# Patient Record
Sex: Female | Born: 1950 | Race: White | Hispanic: No | Marital: Married
Health system: Midwestern US, Community
[De-identification: ages and names within clinical notes are randomized; demographics above are authoritative.]

## PROBLEM LIST (undated history)

## (undated) DIAGNOSIS — R52 Pain, unspecified: Secondary | ICD-10-CM

## (undated) DIAGNOSIS — R109 Unspecified abdominal pain: Secondary | ICD-10-CM

## (undated) DIAGNOSIS — Z1239 Encounter for other screening for malignant neoplasm of breast: Secondary | ICD-10-CM

## (undated) DIAGNOSIS — R634 Abnormal weight loss: Secondary | ICD-10-CM

## (undated) DIAGNOSIS — M797 Fibromyalgia: Secondary | ICD-10-CM

## (undated) DIAGNOSIS — R9389 Abnormal findings on diagnostic imaging of other specified body structures: Secondary | ICD-10-CM

## (undated) DIAGNOSIS — M47812 Spondylosis without myelopathy or radiculopathy, cervical region: Secondary | ICD-10-CM

## (undated) DIAGNOSIS — M25512 Pain in left shoulder: Secondary | ICD-10-CM

## (undated) DIAGNOSIS — E039 Hypothyroidism, unspecified: Secondary | ICD-10-CM

## (undated) DIAGNOSIS — M7501 Adhesive capsulitis of right shoulder: Secondary | ICD-10-CM

---

## 2015-04-04 ENCOUNTER — Ambulatory Visit: Admit: 2015-04-04 | Discharge: 2015-04-04 | Payer: MEDICARE | Attending: Interventional Pain Medicine

## 2015-04-04 DIAGNOSIS — M47812 Spondylosis without myelopathy or radiculopathy, cervical region: Secondary | ICD-10-CM

## 2015-04-04 NOTE — Progress Notes (Signed)
Tiffany Wolfe  (December 02, 1950)    04/04/15    Subjective:     Tiffany Wolfe is 64 y.o. female who is here today for follow-up. It has been over a year since we have seen her. We usually treat her for her low back but now her neck has been bothering her. She is also having more numbness in the hands, both hands bother her. Constant numbness, worse when she is driving. She has not had carpal tunnel surgery. She is right handed. No neck surgery. She takes Aleve which helps to some degree. She has generalized arthritis which affects her quality of life, mood, activities of daily living and chores around the house. She has bilateral neck pain radiating into her upper shoulder blade region. Pain with medications 3/10, on a bad day 8/10. No neck surgery. She does not work. She used to work at [employer].    Chief Complaint   Patient presents with   ??? Neck Pain         Allergies:  Review of patient's allergies indicates no known allergies.    No past medical history on file.  No past surgical history on file.  No family history on file.  Social History     Social History   ??? Marital status: Married     Spouse name: N/A   ??? Number of children: N/A   ??? Years of education: N/A     Occupational History   ??? Not on file.     Social History Main Topics   ??? Smoking status: Never Smoker   ??? Smokeless tobacco: Not on file   ??? Alcohol use 0.0 oz/week     0 Standard drinks or equivalent per week   ??? Drug use: No   ??? Sexual activity: Not on file     Other Topics Concern   ??? Not on file     Social History Narrative   ??? No narrative on file       No current outpatient prescriptions on file prior to visit.     No current facility-administered medications on file prior to visit.        She has not tried physical therapy.   Date of last of last PT treatment: none    HPI    Review of Systems   Constitutional: Negative for fever.   HENT: Negative for hearing loss.    Respiratory: Negative for shortness of breath.    Gastrointestinal: Negative  for nausea, diarrhea and constipation.   Genitourinary: Negative for difficulty urinating.   Musculoskeletal: Positive for neck pain. Negative for back pain.   Skin: Negative for rash.   Neurological: Negative for headaches.   Hematological: Bruises/bleeds easily.   Psychiatric/Behavioral: Positive for sleep disturbance.         Objective:     Vitals:  Visit Vitals   ??? Temp 97.1 ??F (36.2 ??C)   ??? Ht  (1.676 m)   ??? Wt 180 lb (81.6 kg)   ??? BMI 29.05 kg/m2      BMI is not within normal limits.  Educational material provided to the patient      Physical Exam   Neurological:   Reflex Scores:       Tricep reflexes are 1+ on the right side and 1+ on the left side.       Bicep reflexes are 1+ on the right side and 1+ on the left side.       Brachioradialis  reflexes are 1+ on the right side and 1+ on the left side.      Patient alert and oriented times three, recent and remote memory intact, mood and affect normal, judgement and insight normal. Strength functional for ambulation. Balance and coordinational functional. Visualized skin intact. No visualized cyanosis, pulses intact. Cranial nerves 2-12 grossly intact. No obvious upper motor neuron signs seen. Sensation intact to light touch.  Negative Spurling's.  Tender along mid/low cervical facet joints with pain and decreased ROM.   Extension and rotation with muscle spasms.    Assessment:     1. Cervical spondylosis without myelopathy  XR Cervical Spine 4 Or 5 Vw    Ambulatory referral to Physical Therapy   2. Carpal tunnel syndrome, bilateral  EMG       Plan:     No orders of the defined types were placed in this encounter.      Orders Placed This Encounter   Procedures   ??? XR Cervical Spine 4 Or 5 Vw     Standing Status:   Future     Number of Occurrences:   1     Standing Expiration Date:   07/03/2015     Scheduling Instructions:      Xray cervical AP Lateral and Lateral Flexion Extension   ??? Ambulatory referral to Physical Therapy     Referral Priority:   Routine      Referral Type:   Physical Medicine     Referral Reason:   Physical Therapy     Requested Specialty:   Physical Therapy     Number of Visits Requested:   1   ??? EMG     Standing Status:   Future     Standing Expiration Date:   07/03/2015     Order Specific Question:   Which body part?     Answer:   Bilateral Upper Extremities     Treatment and Recommendations:  I discussed options with the patient in detail. We will get cervical spine x-rays. She may need cervical facet joint injections in the future but we will order physical therapy on her neck for strengthening, stretching and home exercise program. For her numbness in her hands we will set up an EMG of the upper extremities to evaluate for carpal tunnel syndrome. She understands the plan and is in agreement. Questions were answered. Chart was reviewed.      Follow up:  Return for EMG internal.    Diora Bellizzi, DO

## 2015-04-05 ENCOUNTER — Encounter

## 2015-04-10 ENCOUNTER — Ambulatory Visit: Admit: 2015-04-10 | Discharge: 2015-04-10 | Payer: MEDICARE | Attending: Interventional Pain Medicine

## 2015-04-10 DIAGNOSIS — G5603 Carpal tunnel syndrome, bilateral upper limbs: Secondary | ICD-10-CM

## 2015-04-10 NOTE — Progress Notes (Signed)
[phone]          EMG/NCV Report    Tiffany Wolfe June 25, 1951          Indications:  Arlo Fabricius is here today for electrodiagnostic testing of the upper extremities to evaluate for carpal tunnel syndrome.    Nerve Conduction Studies:  Nerve conduction studies of the upper extremities were tested. These were all normal with the exception of a mild to moderate delay in the bilateral median sensory distal latencies, worse on the right than on the left. There was also a reduction in the amplitude, more on the right than left. There was also mild prolongation of the right median motor distal latency. The left side was normal. Other nerves tested were normal including limb temperature.    Needle examination of bilateral deltoid, biceps, triceps, pronator teres, first dorsal interosseous of hand, abductor pollicis brevis, and brachioradialis were tested. Some neurogenic units were seen in the bilateral abductor pollicis brevis muscles. There was no active denervation seen. There were also some neurogenic units seen in the C6 muscles tested of 1+ increased amplitude and duration.    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 The right is moderate, the left is mild. Some chronic neurogenic units were also seen at C6. We went over the x-ray results. She will do therapy. We will see her in six weeks for follow-up. If she has a flare up of her neck pain then she may need the cervical facet joint injections sooner. We will also order her some bilateral carpal tunnel wrist splints to wear at night. She understands the plan and is in agreement. Questions were answered.       Ileana Roup, DO

## 2015-04-18 ENCOUNTER — Encounter: Attending: Rehabilitative and Restorative Service Providers"

## 2015-04-18 NOTE — Telephone Encounter (Signed)
I left a voicemail for patient to call.

## 2015-04-25 NOTE — Telephone Encounter (Signed)
I spoke to patient, she wants to pick up her carpal tunnel splints from the front desk.  I will give the front desk both splints and the bill for patient to sign.

## 2015-04-26 ENCOUNTER — Encounter

## 2015-04-26 DIAGNOSIS — G5603 Carpal tunnel syndrome, bilateral upper limbs: Secondary | ICD-10-CM

## 2015-04-30 NOTE — Progress Notes (Signed)
[facility]  Spine Surgery  Advanced Pain Management           DME Coordinator: Simmie Daviesrystal Kairee Kozma        Patient Name: Tiffany Wolfe DOB: [birth year]        Date: 04/26/2015       DME Fitting: Bilateral Wrist Splint - [phone]    The above brace has been ordered by Dr. Lucianne MussKumar for bilateral carpal tunnel splints. she will benefit from this brace as it helps to reduce weakness and instability of the wrist. This rigid orthosis is required to stabilize the wrist to improve function and decrease pain.    I have shown the patient the brace and demonstrated how to wear it and adjust the straps. I advised the patient wear the splint at night or as directed by their provider.     I advised the patient to adjust the brace for comfort and loosen or tighten as needed while maintaining restriction of motion during the night.    I had the patient try on the brace and we assessed the fit. The patient was fit the standard size splint.  Per my evaluation and discussion with the patient, this brace will work well for the patient.    The patient expressed satisfaction with the fit of this brace and understanding of it's use.                  [address], [address] [place], [place] [id number]  Phone [phone]/Fax [phone]

## 2015-05-22 ENCOUNTER — Encounter: Attending: Interventional Pain Medicine

## 2015-05-22 ENCOUNTER — Encounter

## 2015-05-23 ENCOUNTER — Encounter: Attending: Rehabilitative and Restorative Service Providers"

## 2016-01-04 IMAGING — DX THORACIC SPINE 2 VIEWS
2 series · 2 of 2 positions shown · non-contrast
Comparison: none

THORACIC SPINE, 01/04/2016 [DATE]:
CLINICAL INDICATION:  Anterior chest pain radiating to the right back.

[lateral]
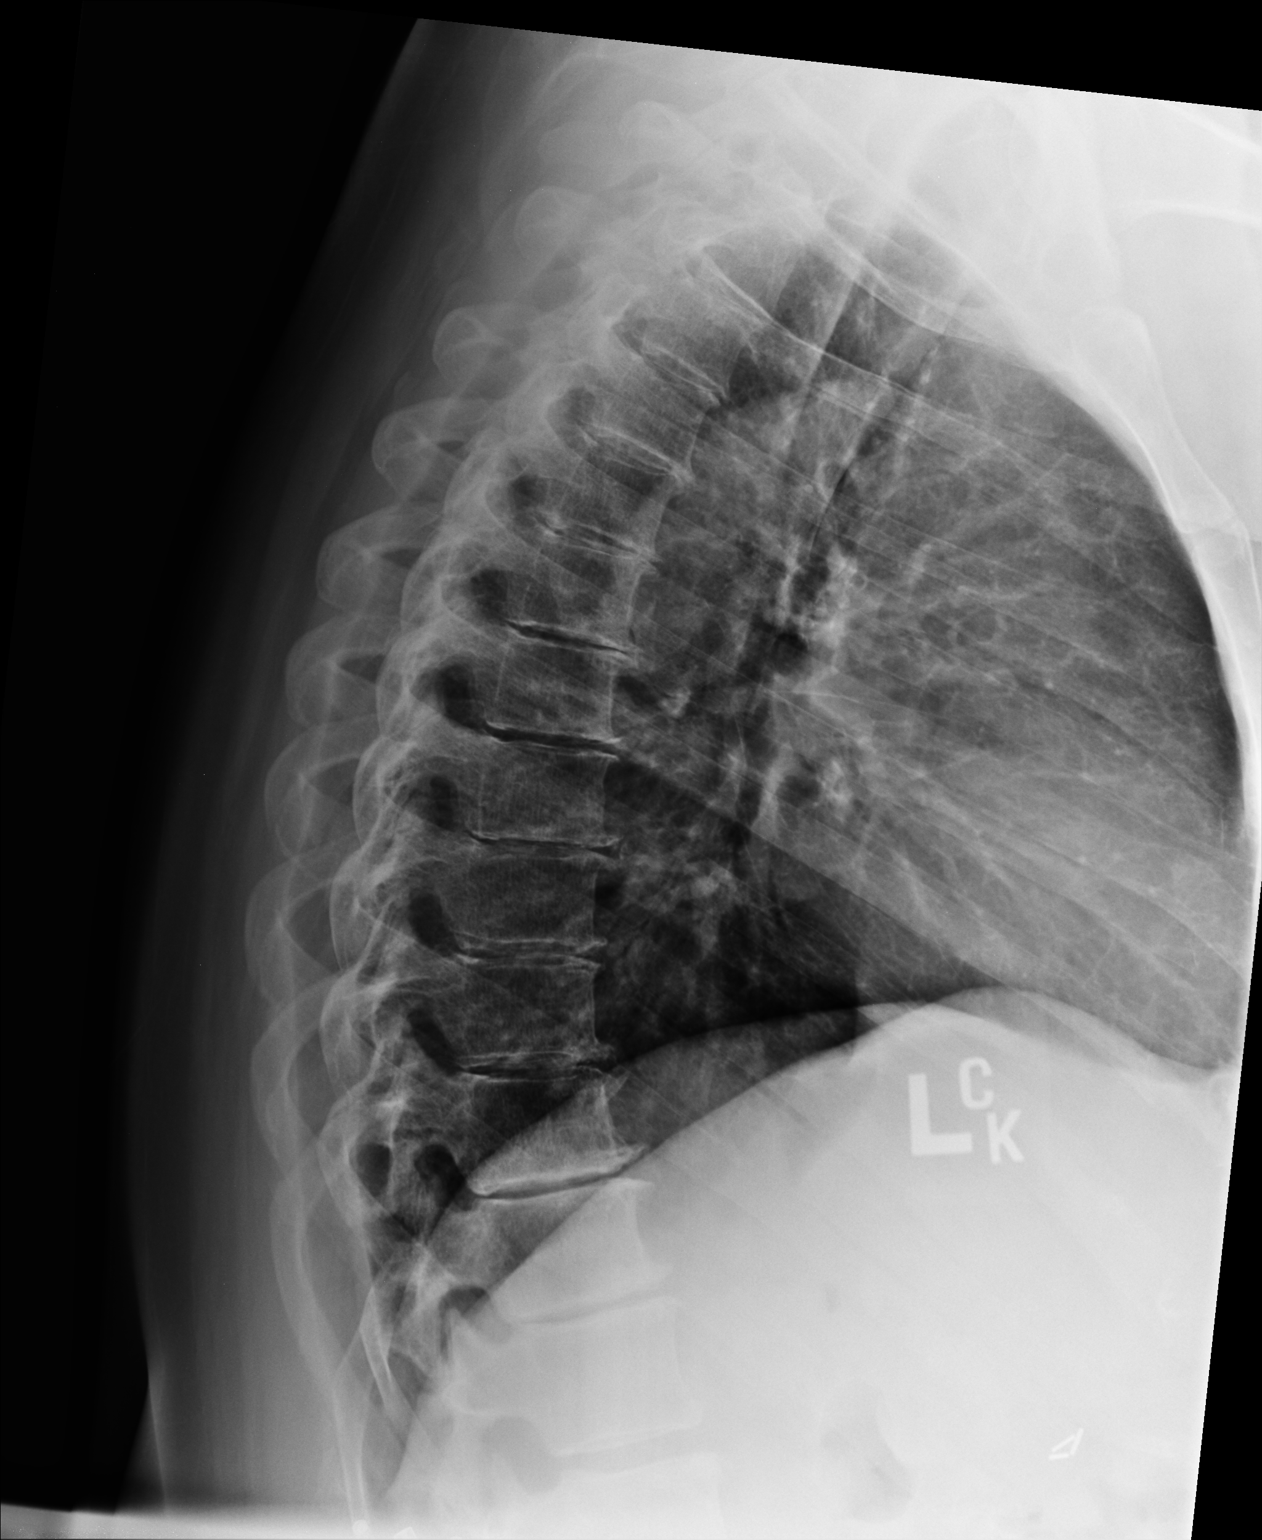

[AP]
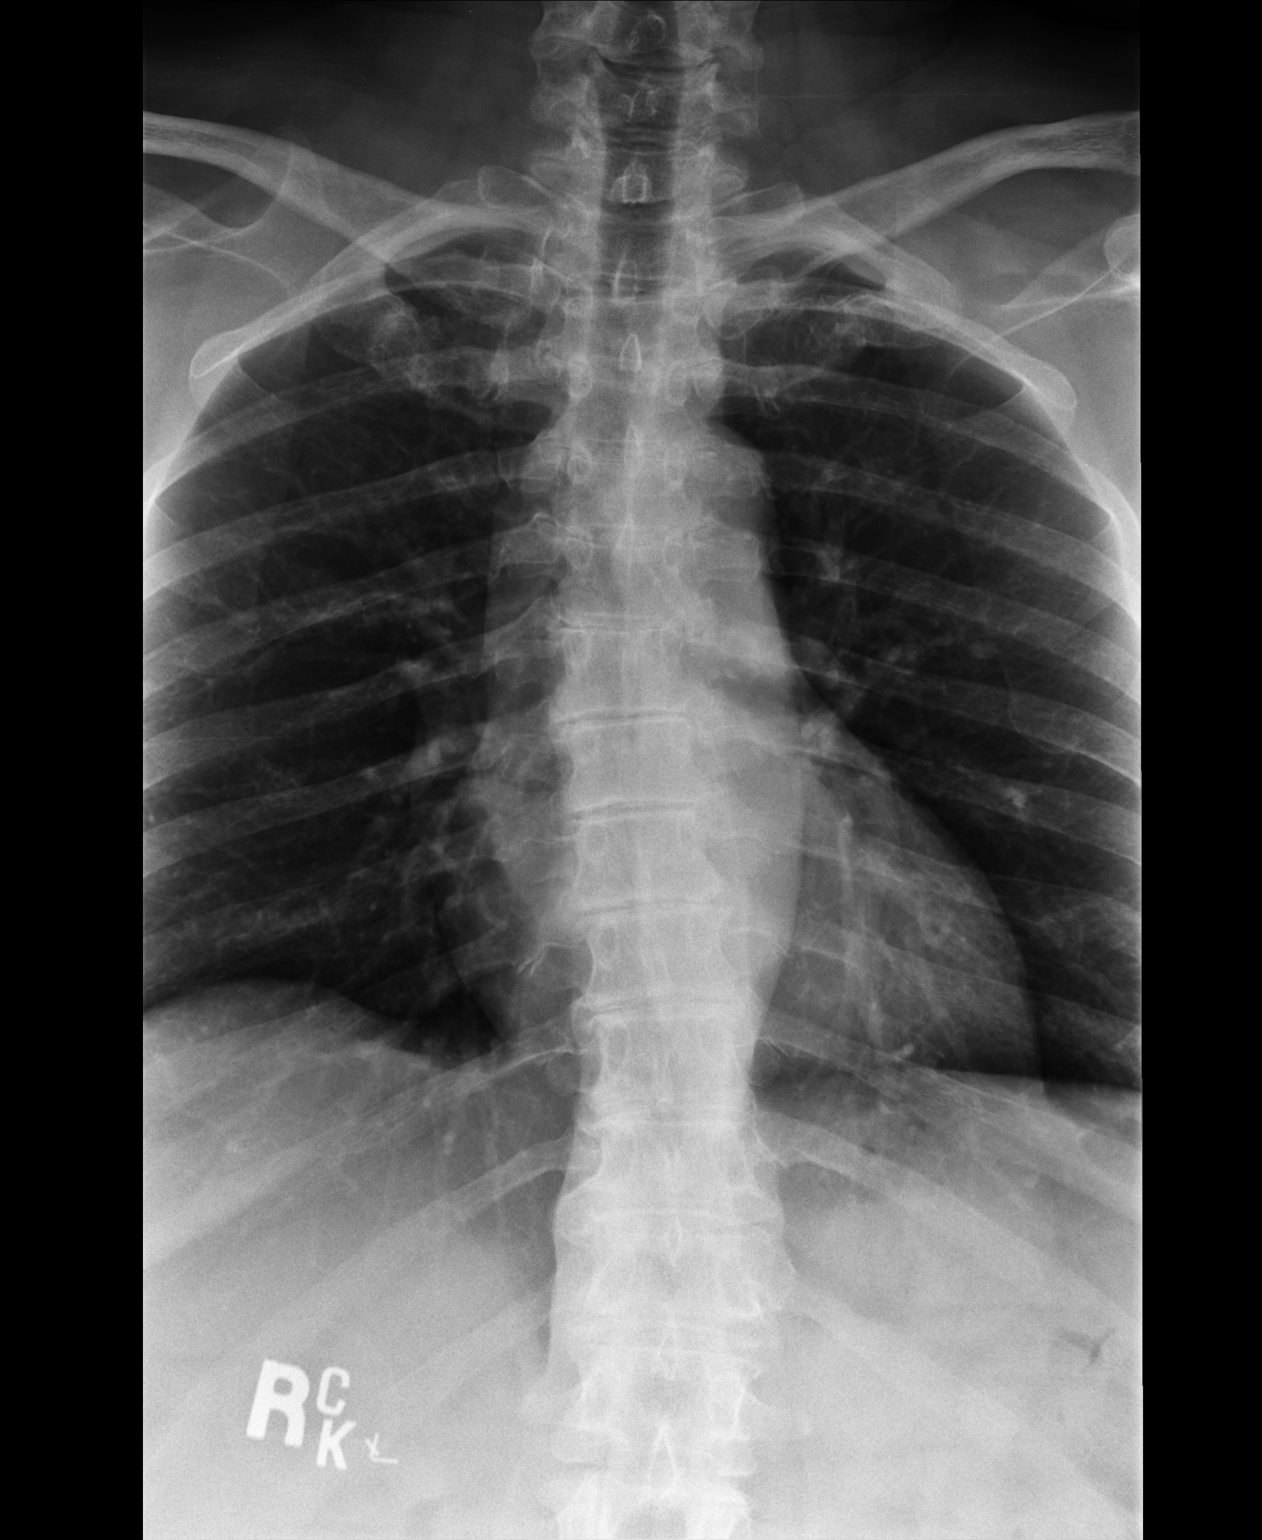

[2 of 2 positions shown; findings below may reference images not displayed]

FINDINGS: There is mild thoracic scoliosis convex to the right. Mild
arthritic
changes present. No compression deformities.
IMPRESSION: Mild arthritic change.

## 2016-01-04 IMAGING — DX CHEST PA AND LATERAL
2 series · 2 of 2 positions shown · non-contrast
Comparison: none

CLINICAL INDICATION:  Anterior chest pain and epigastric pain radiating under
right shoulder blade for 6 weeks.

[lateral]
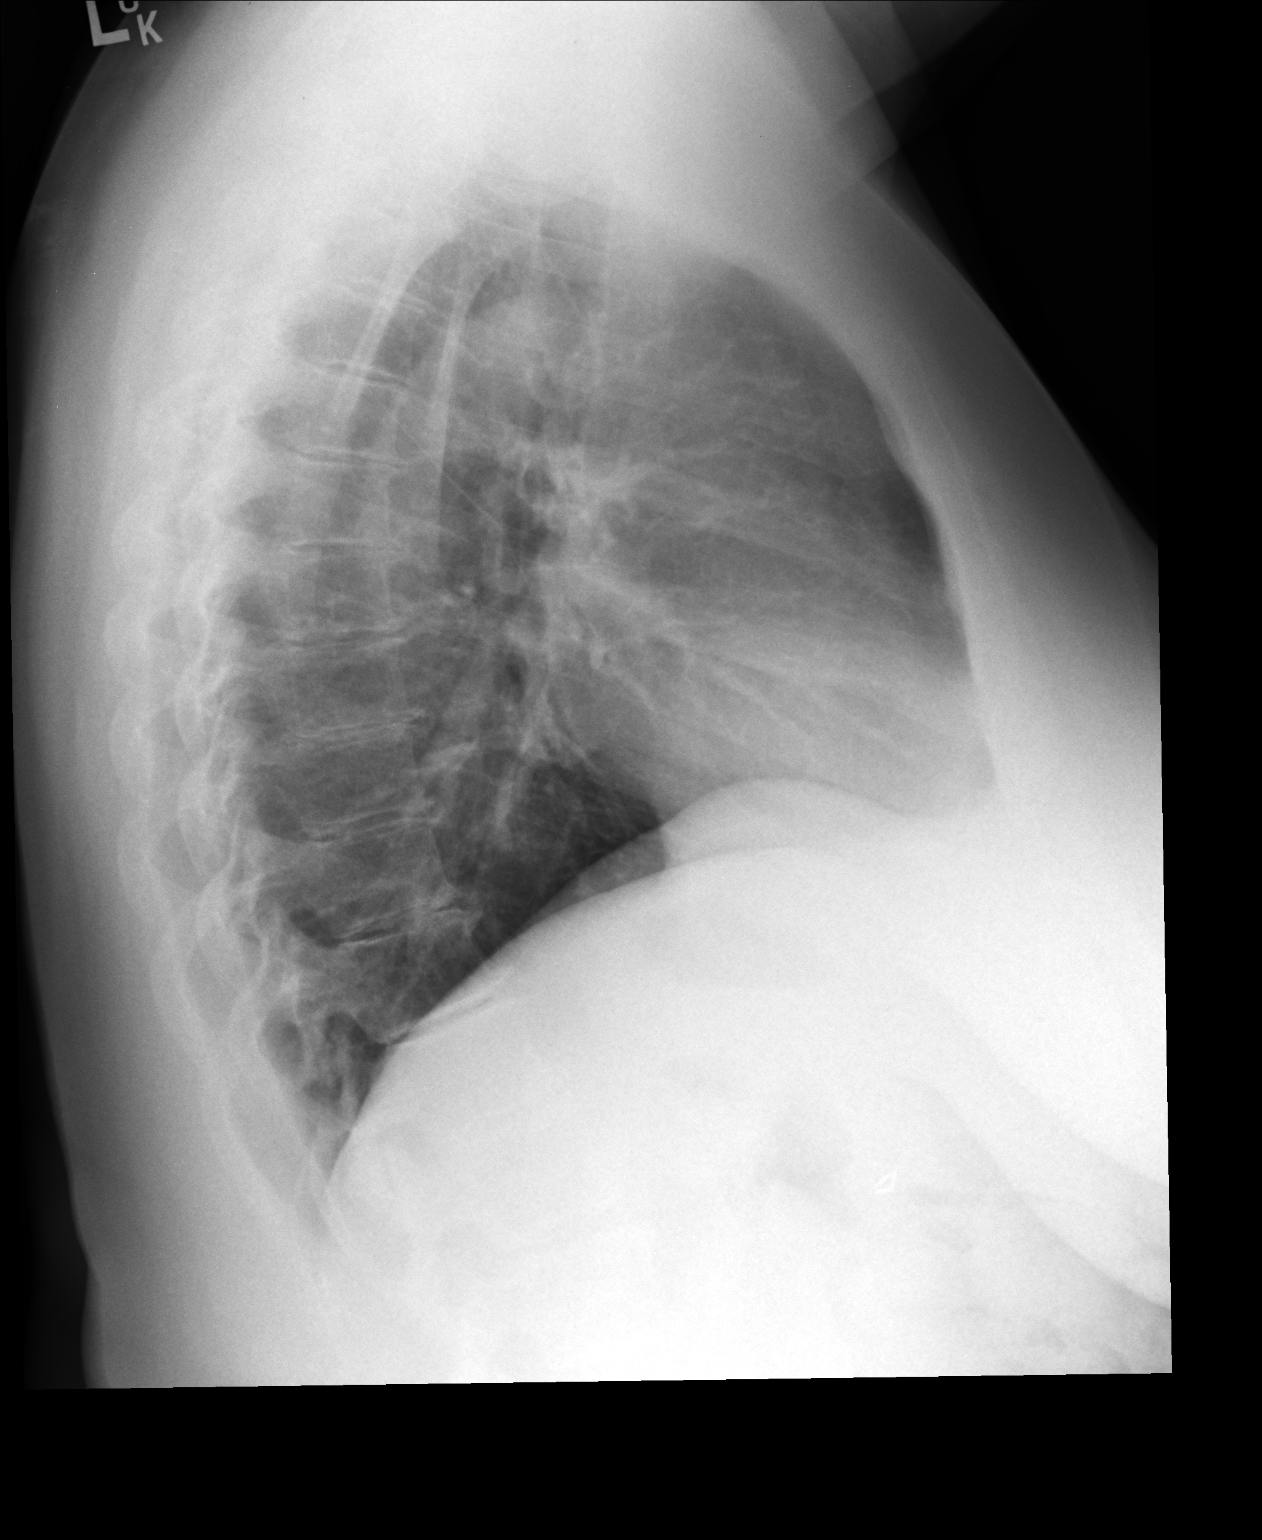

[PA]
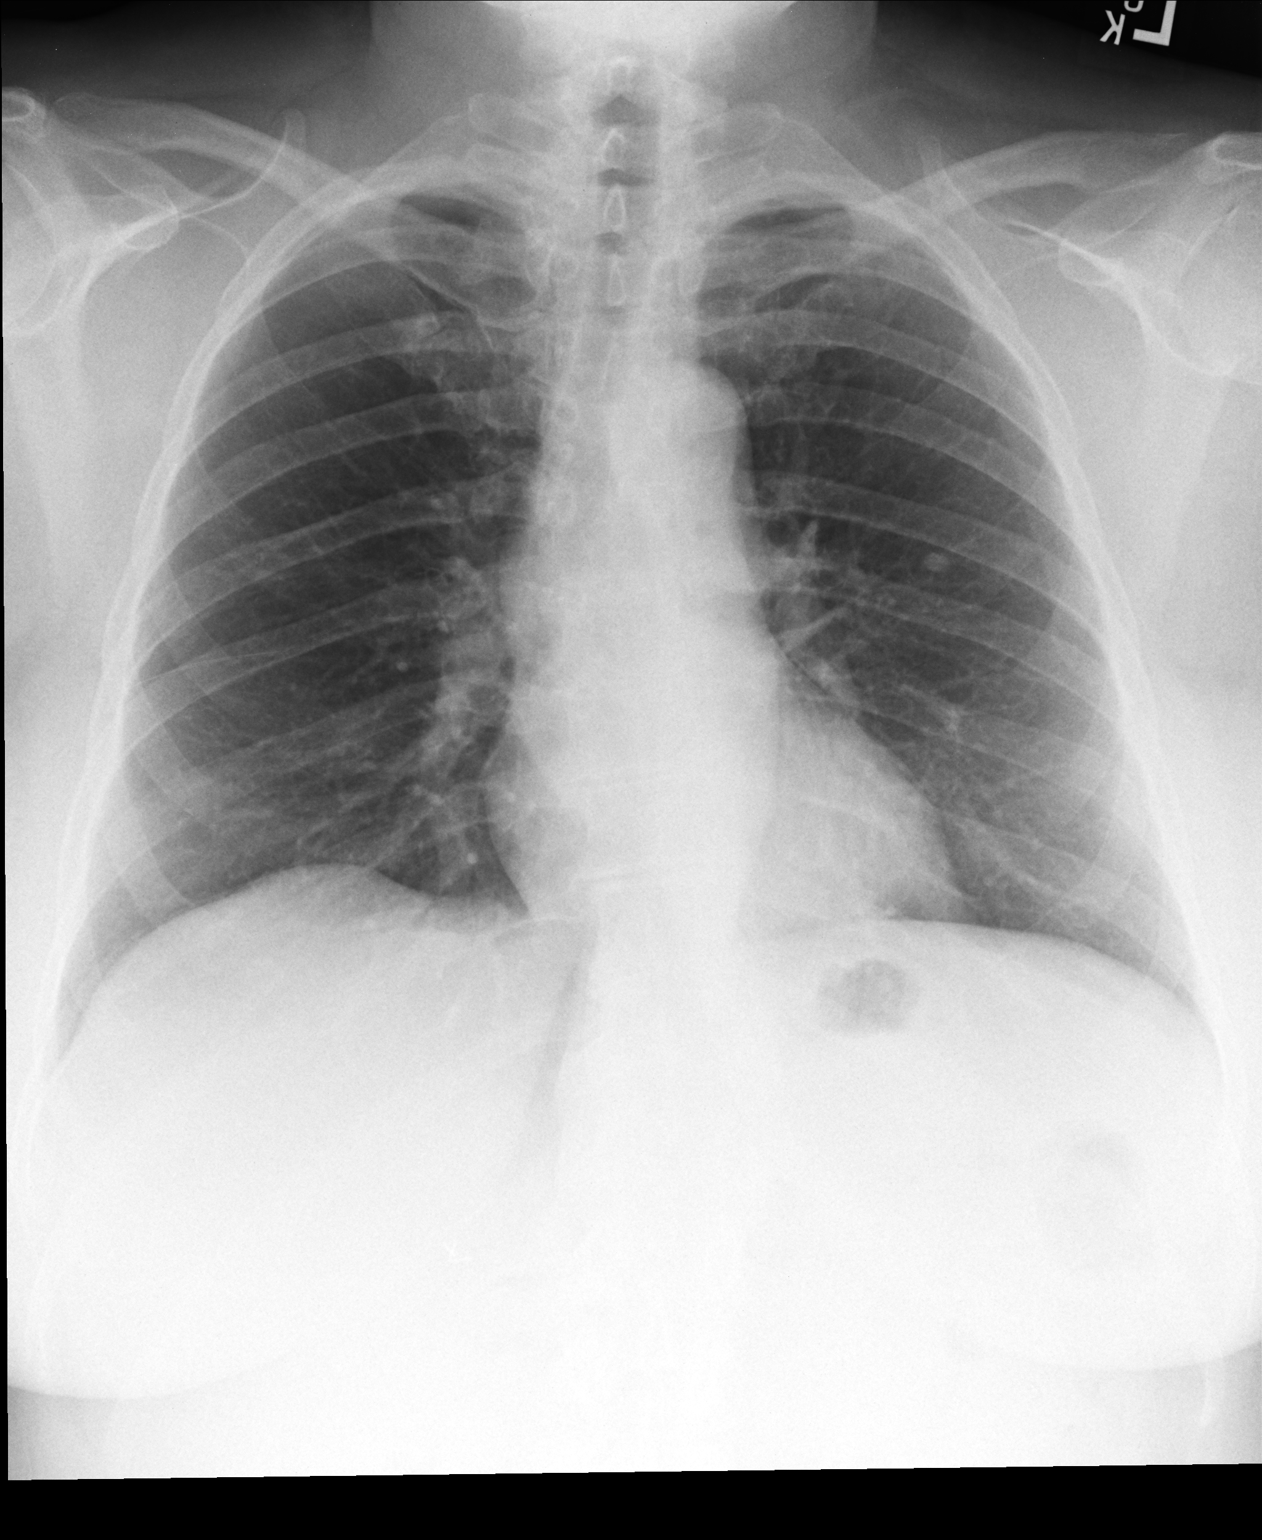

[2 of 2 positions shown; findings below may reference images not displayed]

FINDINGS: A calcified granuloma is present in the left midlung field. The
lungs
are well-expanded and clear. Heart size normal. The bones are intact.
Cholecystectomy.
IMPRESSION: Calcified granuloma in the left midlung field. No acute findings.

## 2017-12-09 IMAGING — CT CT LUNG SCREENING
1 of 3 series · 4 of 36 positions shown, 5 images · non-contrast
Comparison: None

CT LUNG SCREENING, 12/09/2017 [DATE]: 
CLINICAL INDICATION:   30 pack years history of smoking. Patient is not a 
current smoker. 
A search for DICOM formatted images was conducted for prior CT imaging studies 
completed at a non-affiliated media free facility.
TECHNIQUE: Screening computed tomographic images of the chest are performed 
from 
the thoracic outlet through the diaphragms without intravenous contrast 
utilizing dose reduction technique. The patient's dose for this study is
mGy.

[Series 4: coronal cor · coronal · 0.64mm/px · 4 of 142 slices shown, 5 images]
[im 29/142  mediastinal]
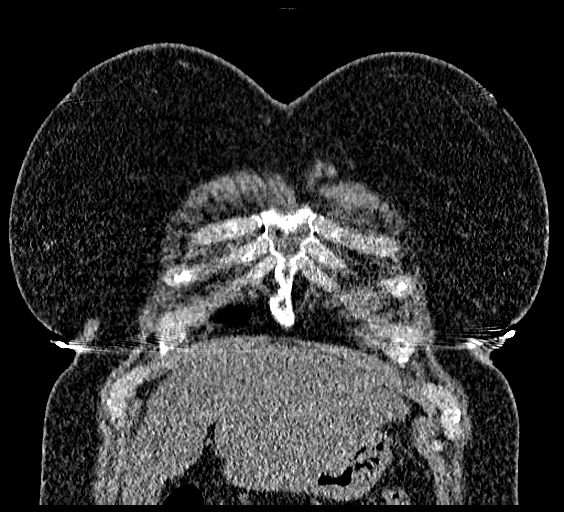
[im 29/142  lung]
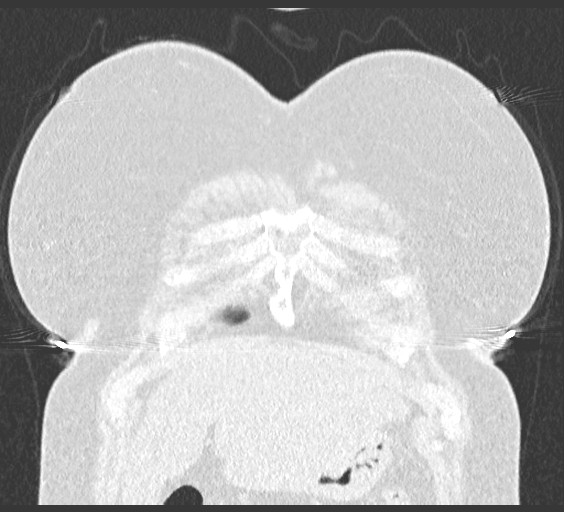
[im 57/142  lung]
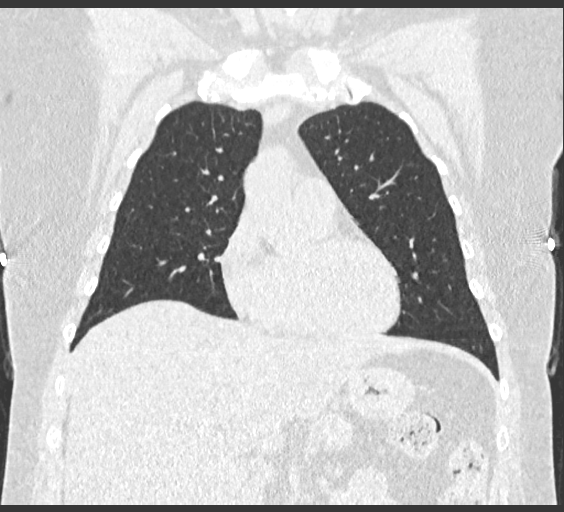
[im 85/142  lung]
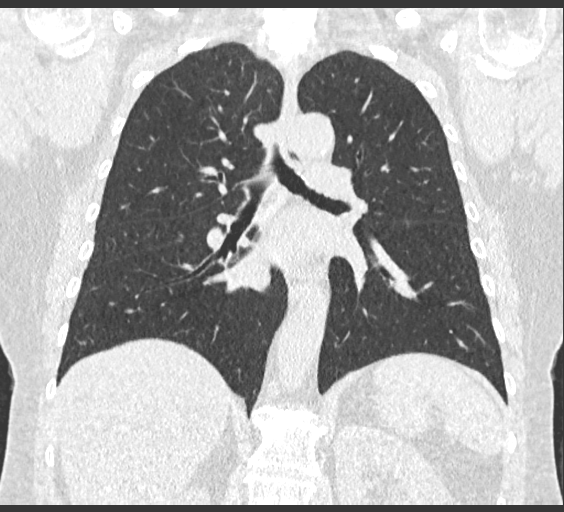
[im 113/142  lung]
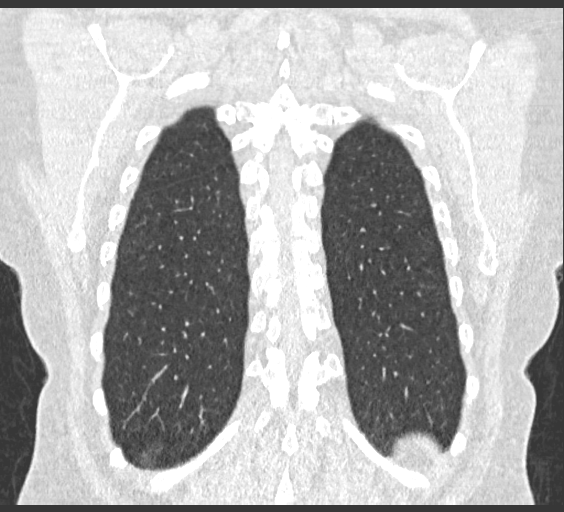

[4 of 36 positions shown; findings below may reference images not displayed]

FINDINGS: There is a small 5 mm calcified granuloma at the posterior segment of the left 
upper lobe. No visible noncalcified pulmonary nodules. The central pulmonary 
artery and thoracic aorta are normal in caliber. Lungs are clear of pulmonary 
infiltrates. No visible liver and adrenals are normal.
IMPRESSION: Small old healed calcified granuloma at the left upper lobe. 
L-RADS:  (1) No nodules and definitely benign nodules.  Continue annual 
screening with low dose CT in 12 months. 
RADIATION DOSE REDUCTION: All CT scans are performed using radiation dose 
reduction techniques, when applicable.  Technical factors are evaluated and 
adjusted to ensure appropriate moderation of exposure.  Automated dose 
management technology is applied to adjust the radiation doses to minimize 
exposure while achieving diagnostic quality images.

## 2017-12-09 IMAGING — MG MAMMOGRAPHY SCREENING BILATERAL 3D TOMOSYNTHESIS WITH CAD
4 of 12 series · 4 of 40 positions shown · non-contrast
Comparison: None available 
BREAST DENSITY: (Level A) The breasts are almost entirely fatty.

MAMMOGRAPHY SCREENING BILATERAL 3D TOMOSYNTHESIS WITH CAD, 12/09/2017 [DATE]: 
CLINICAL INDICATION: Screening
TECHNIQUE: Digital bilateral mammograms and 3-D Tomosynthesis were obtained. 
These were interpreted both primarily and with the aid of computer-aided 
detection system.

[R CC synth-2D]
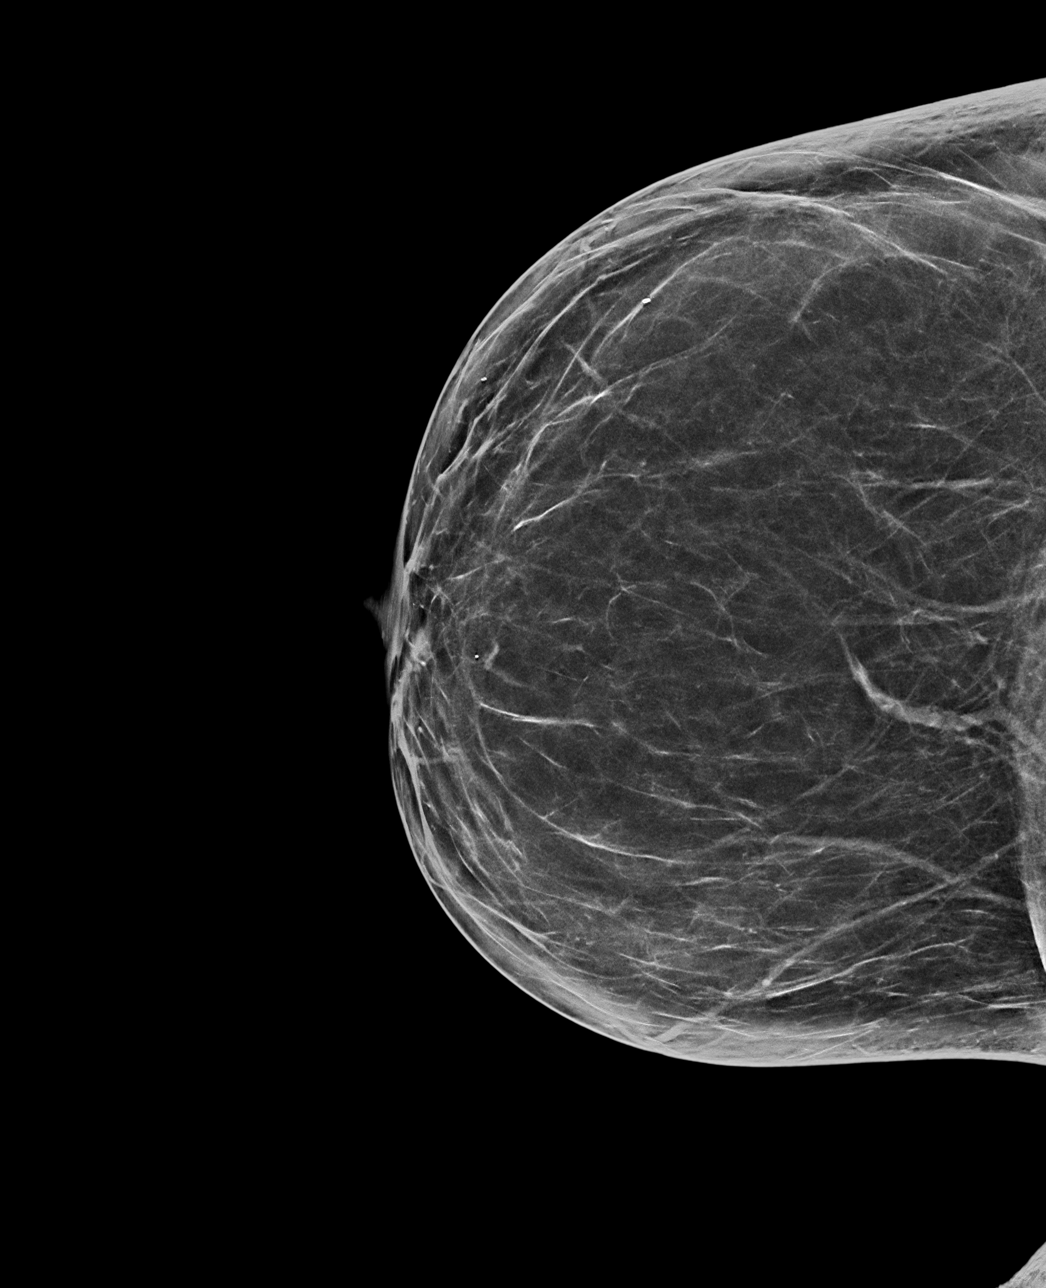

[L MLO synth-2D]
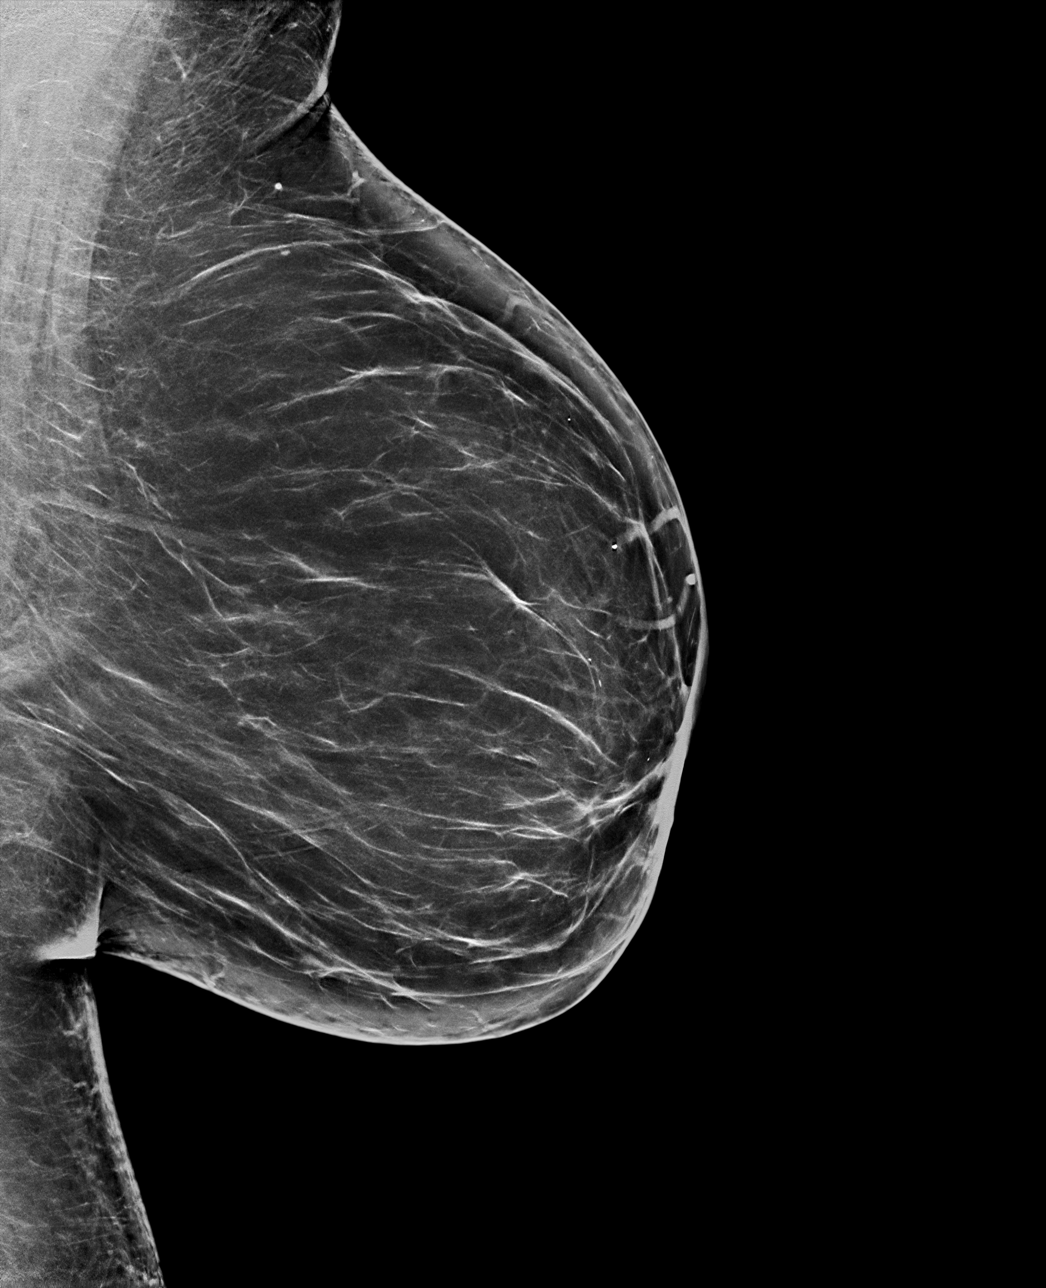

[R MLO synth-2D]
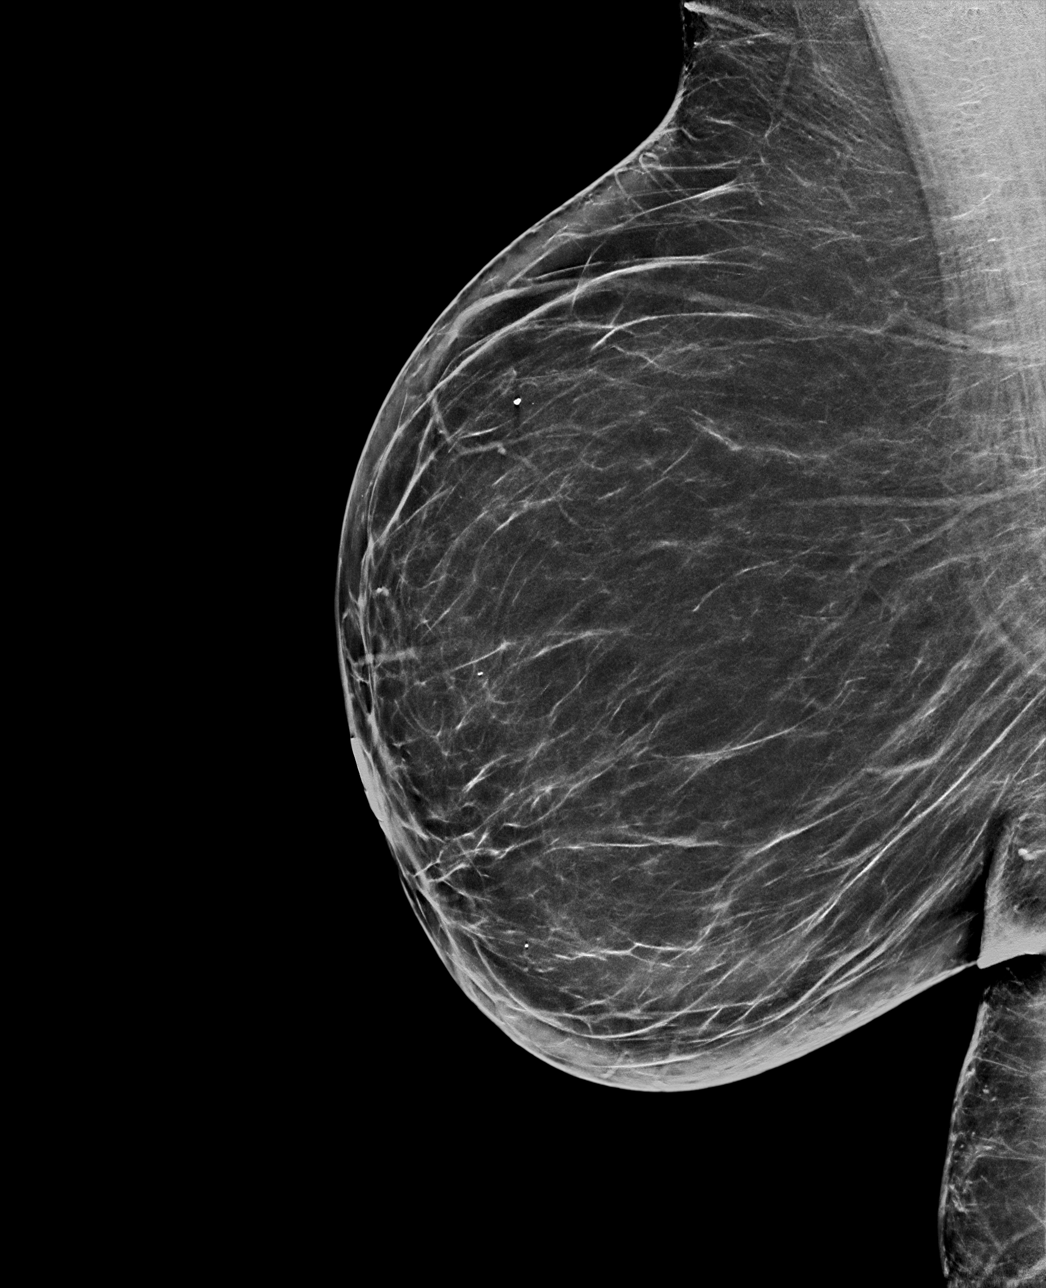

[L CC synth-2D]
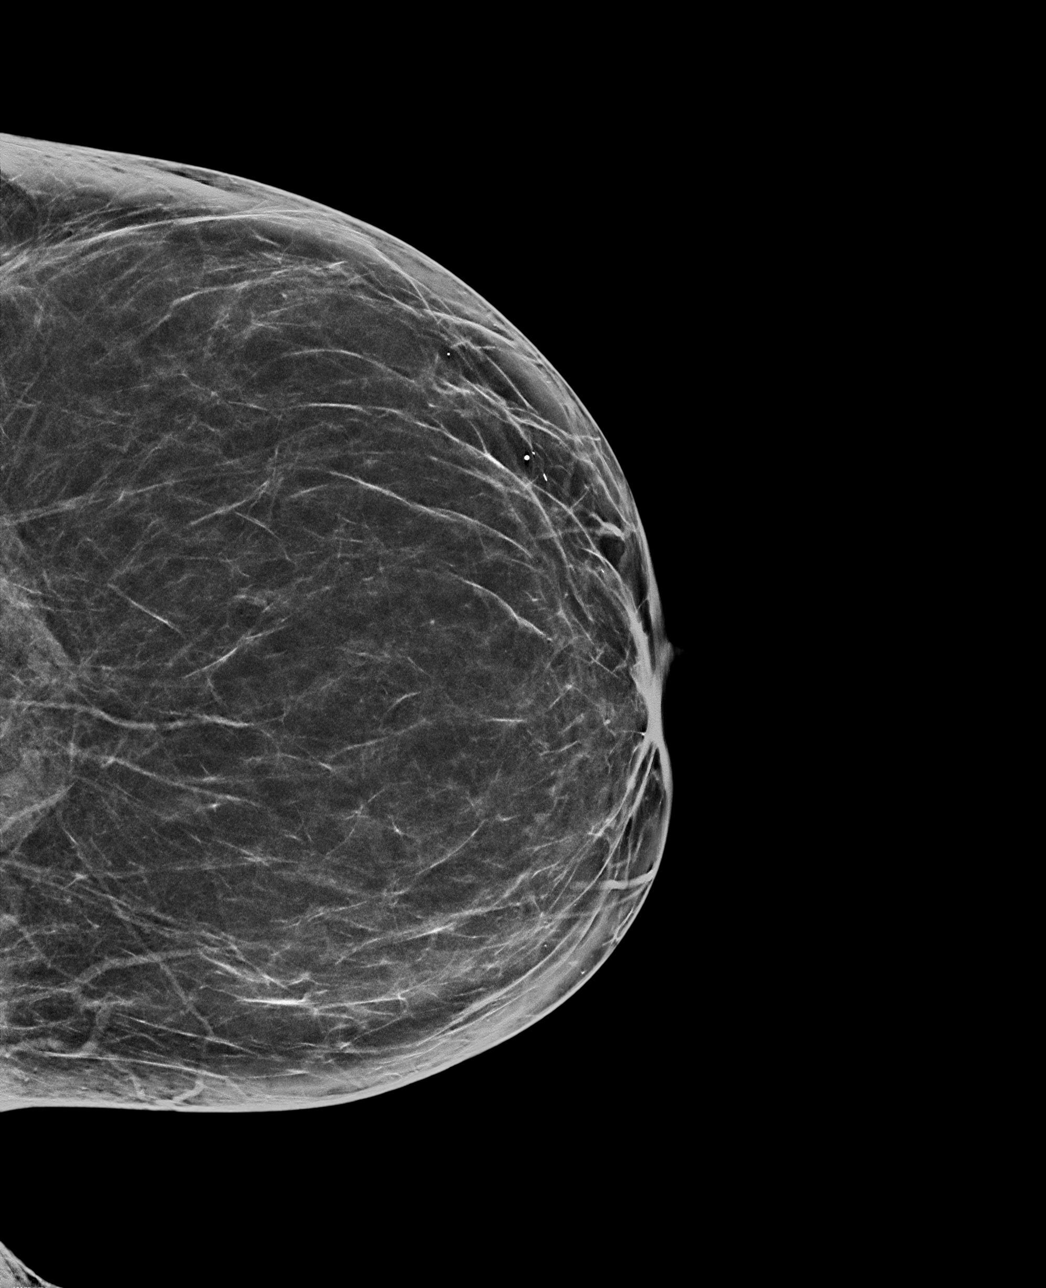

[4 of 40 positions shown; findings below may reference images not displayed]

FINDINGS: Scattered fibroglandular densities are seen bilaterally. Benign 
calcifications are identified. No mass or malignant-appearing 
microcalcifications are seen. No evidence of skin thickening.
IMPRESSION: ( BI-RADS 2) Benign findings. Routine mammographic follow-up is recommended.

## 2017-12-22 LAB — HEPATITIS C ANTIBODY: Antibody Screen: NEGATIVE

## 2018-03-31 NOTE — Progress Notes (Signed)
Patient: Tiffany Wolfe    Date of Birth: 02-19-1951    Date: 03/31/18       Patient Active Problem List    Diagnosis Date Noted   ??? 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 Urinary tract infectious disease 03/31/2018   ??? Urinary incontinence 03/31/2018   ??? Pterygium of left eye 03/03/2017   ??? Fatty liver 05/27/2016   ??? Hypertension 05/27/2016   ??? Early stage nonexudative age-related macular degeneration of both eyes 08/21/2015   ??? Migraine with aura and without status migrainosus, not intractable 08/21/2015   ??? Pain in right eye 08/16/2015   ??? Cervical spondylosis without myelopathy 04/04/2015   ??? Astigmatism with presbyopia 03/15/2015   ??? Floaters 03/15/2015   ??? Pseudophakia 03/15/2015   ??? Fibromyalgia 03/24/2014   ??? GERD (gastroesophageal reflux disease) 03/24/2014   ??? HLD (hyperlipidemia) 03/24/2014   ??? Hypothyroid 03/24/2014     Past Medical History:   Diagnosis Date   ??? Fibromyalgia    ??? Hyperlipidemia    ??? Hypertension    ??? Hypothyroidism      Past Surgical History:   Procedure Laterality Date   ??? CHOLECYSTECTOMY     ??? HYSTERECTOMY, VAGINAL     ??? SPINAL FUSION       Family History   Problem Relation Age of Onset   ??? Mult Sclerosis Brother    ??? Cancer Brother      Social History     Socioeconomic History   ??? Marital status: Married     Spouse name: Not on file   ??? Number of children: Not on file   ??? Years of education: Not on file   ??? Highest education level: Not on file   Occupational History   ??? Not on file   Social Needs   ??? Financial resource strain: Not on file   ??? Food insecurity:     Worry: Not on file     Inability: Not on file   ??? Transportation needs:     Medical: Not on file     Non-medical: Not on file   Tobacco Use   ??? Smoking status: Former Smoker     Packs/day: 2.00     Years: 30.00     Pack years: 60.00     Types: Cigarettes     Last attempt to quit: 03/31/2006     Years since quitting: 12.0   ??? Smokeless tobacco: Never Used   Substance and Sexual Activity   ??? Alcohol use: Yes     Alcohol/week: 0.0 oz   ???  Drug use: No   ??? Sexual activity: Not on file   Lifestyle   ??? Physical activity:     Days per week: Not on file     Minutes per session: Not on file   ??? Stress: Not on file   Relationships   ??? Social connections:     Talks on phone: Not on file     Gets together: Not on file     Attends religious service: Not on file     Active member of club or organization: Not on file     Attends meetings of clubs or organizations: Not on file     Relationship status: Not on file   ??? Intimate partner violence:     Fear of current or ex partner: Not on file     Emotionally abused: Not on file     Physically abused: Not on file  Forced sexual activity: Not on file   Other Topics Concern   ??? Not on file   Social History Narrative   ??? Not on file     Current Outpatient Medications on File Prior to Visit   Medication Sig Dispense Refill   ??? DULoxetine (CYMBALTA) 20 MG extended release capsule Take 40 mg by mouth daily  3   ??? levothyroxine (SYNTHROID) 200 MCG tablet Take 200 mcg by mouth daily     ??? metoprolol succinate (TOPROL XL) 50 MG extended release tablet Take 50 mg by mouth daily     ??? simvastatin (ZOCOR) 10 MG tablet Take 10 mg by mouth daily     ??? ibuprofen (ADVIL;MOTRIN) 200 MG tablet Take 200 mg by mouth every 6 hours as needed for Pain       No current facility-administered medications on file prior to visit.      Allergies   Allergen Reactions   ??? Seasonal Itching       Chief Complaint   Patient presents with   ??? Establish Care     Former Dr. Laurie Panda    ??? Shoulder Pain     LT x 2 weeks, no known injury        HPI    Symptoms:Pain  Location:L shoulder  Quality:Aches, shooting pain  Severity:8/10  Duration:2 weeks  Timing:constant  Context:Denies  Alleviating/aggravting factors: Motrin HS relieves symptoms.  Aggravated with movement  Associated signs & symptoms:Ocassional numbness, tingling down the arm.  Denies change in strength   Denies fever chills   No limitation to ROM 2/2 pain  Never had this before   She is right  handed    Review of Systems   Constitutional: Negative for activity change, appetite change and chills.   HENT: Negative for congestion, dental problem and drooling.    Musculoskeletal: Negative for gait problem and joint swelling.        LT shoulder pain        Physical Exam  Vitals:    03/31/18 1338   BP: 118/60   Pulse: 57   SpO2: 98%   Body mass index is 27.66 kg/m??.  Physical Exam   Constitutional: She appears well-developed and well-nourished. No distress.   HENT:   Head: Normocephalic and atraumatic.   Right Ear: External ear normal.   Left Ear: External ear normal.   Eyes: Conjunctivae and EOM are normal.   Neck: Normal range of motion.   Musculoskeletal:        Left shoulder: She exhibits tenderness and pain. She exhibits normal range of motion, no bony tenderness, no swelling, no effusion, no crepitus, no deformity, no laceration, no spasm, normal pulse and normal strength.   Skin: She is not diaphoretic.   Nursing note and vitals reviewed.      Assessment:  Tiffany Wolfe was seen today for establish care and shoulder pain.    Diagnoses and all orders for this visit:    Acute pain of left shoulder  -     XR SHOULDER LEFT (MIN 2 VIEWS); Future  Suspect frozen shoulder  Check x-ray   F/u shoulder injection  hand out provided, had a lengthy discussion with patient about treatment, it's side effects, the diagnosis & etiology of symptoms, along with management and expectations of outcome with the recommended treatment. Patient verbalized understanding.        Orders Placed This Encounter   Procedures   ??? XR SHOULDER LEFT (MIN 2 VIEWS)  Standing Status:   Future     Standing Expiration Date:   04/01/2019     Order Specific Question:   Reason for exam:     Answer:   left shoulder pain      Total visit time >30 mins, more than 50% time spent on counseling, treatment, side effects, and follow up      Willis ModenaSaadia Donnette Macmullen, MD

## 2018-04-06 NOTE — Telephone Encounter (Signed)
Patient calling asking for x-ray results. She was scheduled to see you yesterday.     Please advise

## 2018-04-06 NOTE — Telephone Encounter (Signed)
X-ray showed:    A possible small fracture - before I or anyone else injects I would like her to see ortho regarding this first. [facility] and give pt information to call and make appt.

## 2018-04-07 NOTE — Telephone Encounter (Signed)
Left vm to return call to the office. 

## 2018-04-08 NOTE — Telephone Encounter (Signed)
Patient would like someone to call her regarding her shoulder xray results

## 2018-04-09 NOTE — Telephone Encounter (Signed)
Left vm for pt to call office back

## 2018-04-09 NOTE — Telephone Encounter (Signed)
Please advise

## 2018-04-12 ENCOUNTER — Encounter

## 2018-04-12 NOTE — Telephone Encounter (Signed)
Patient advised and information given. Referral placed

## 2018-06-04 ENCOUNTER — Encounter

## 2018-09-01 ENCOUNTER — Ambulatory Visit: Admit: 2018-09-01 | Discharge: 2018-09-01 | Payer: MEDICARE | Attending: Family Medicine

## 2018-09-01 ENCOUNTER — Inpatient Hospital Stay: Admit: 2018-09-01 | Payer: MEDICARE

## 2018-09-01 ENCOUNTER — Inpatient Hospital Stay: Payer: MEDICARE

## 2018-09-01 ENCOUNTER — Encounter

## 2018-09-01 DIAGNOSIS — R109 Unspecified abdominal pain: Secondary | ICD-10-CM

## 2018-09-01 LAB — POCT URINALYSIS DIPSTICK
Spec Grav, UA: 1.025
pH, UA: 6

## 2018-09-01 LAB — COMPREHENSIVE METABOLIC PANEL
ALT: 45 U/L — ABNORMAL HIGH (ref 0–33)
AST: 49 U/L — ABNORMAL HIGH (ref 0–35)
Albumin: 4.8 g/dL — ABNORMAL HIGH (ref 3.5–4.6)
Alkaline Phosphatase: 63 U/L (ref 40–130)
Anion Gap: 13 mEq/L (ref 9–15)
BUN: 14 mg/dL (ref 8–23)
CO2: 27 mEq/L (ref 20–31)
Calcium: 9.8 mg/dL (ref 8.5–9.9)
Chloride: 99 mEq/L (ref 95–107)
Creatinine: 0.74 mg/dL (ref 0.50–0.90)
GFR African American: >60 (ref >60)
GFR Non-African American: >60 (ref >60)
Globulin: 3.1 g/dL (ref 2.3–3.5)
Glucose: 113 mg/dL — ABNORMAL HIGH (ref 70–99)
Potassium: 4.2 mEq/L (ref 3.4–4.9)
Sodium: 139 mEq/L (ref 135–144)
Total Bilirubin: 0.4 mg/dL (ref 0.2–0.7)
Total Protein: 7.9 g/dL (ref 6.3–8.0)

## 2018-09-01 MED ORDER — BUDESONIDE-FORMOTEROL FUMARATE 80-4.5 MCG/ACT IN AERO
Freq: Two times a day (BID) | RESPIRATORY_TRACT | 3 refills | Status: DC
Start: 2018-09-01 — End: 2018-09-22

## 2018-09-01 MED ORDER — ALBUTEROL SULFATE HFA 108 (90 BASE) MCG/ACT IN AERS
108 (90 Base) MCG/ACT | Freq: Four times a day (QID) | RESPIRATORY_TRACT | 3 refills | Status: DC | PRN
Start: 2018-09-01 — End: 2018-09-22

## 2018-09-01 NOTE — Progress Notes (Signed)
Vaccine Information Sheet, "Influenza - Inactivated" OR "Live - Intranasal"  given to Tiffany MediciPamela J Bonnet, or parent/legal guardian of  Tiffany Mediciamela J Sedita and verbalized understanding.    Patient responses:    Have you ever had a reaction to a flu vaccine? No  Are you able to eat eggs without adverse effects?  Yes  Do you have any current illness?  No  Have you ever had Guillian Barre Syndrome?  No    Flu vaccine given per order. Please see immunization tab.

## 2018-09-01 NOTE — Progress Notes (Signed)
Patient: Tiffany Wolfe    Date of Birth: 1951-08-13    Date: 09/01/18       Patient Active Problem List    Diagnosis Date Noted   ??? 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 Urinary tract infectious disease 03/31/2018   ??? Urinary incontinence 03/31/2018   ??? Pterygium of left eye 03/03/2017   ??? Fatty liver 05/27/2016   ??? Hypertension 05/27/2016   ??? Early stage nonexudative age-related macular degeneration of both eyes 08/21/2015   ??? Migraine with aura and without status migrainosus, not intractable 08/21/2015   ??? Pain in right eye 08/16/2015   ??? Cervical spondylosis without myelopathy 04/04/2015   ??? Astigmatism with presbyopia 03/15/2015   ??? Floaters 03/15/2015   ??? Pseudophakia 03/15/2015   ??? Fibromyalgia 03/24/2014   ??? GERD (gastroesophageal reflux disease) 03/24/2014   ??? HLD (hyperlipidemia) 03/24/2014   ??? Hypothyroid 03/24/2014     Past Medical History:   Diagnosis Date   ??? Fibromyalgia    ??? Hyperlipidemia    ??? Hypertension    ??? Hypothyroidism      Past Surgical History:   Procedure Laterality Date   ??? CHOLECYSTECTOMY     ??? HYSTERECTOMY, VAGINAL     ??? SPINAL FUSION       Family History   Problem Relation Age of Onset   ??? Mult Sclerosis Brother    ??? Cancer Brother      Social History     Socioeconomic History   ??? Marital status: Married     Spouse name: Not on file   ??? Number of children: Not on file   ??? Years of education: Not on file   ??? Highest education level: Not on file   Occupational History   ??? Not on file   Social Needs   ??? Financial resource strain: Not on file   ??? Food insecurity:     Worry: Not on file     Inability: Not on file   ??? Transportation needs:     Medical: Not on file     Non-medical: Not on file   Tobacco Use   ??? Smoking status: Former Smoker     Packs/day: 2.00     Years: 30.00     Pack years: 60.00     Types: Cigarettes     Last attempt to quit: 03/31/2006     Years since quitting: 12.4   ??? Smokeless tobacco: Never Used   Substance and Sexual Activity   ??? Alcohol use: Yes     Alcohol/week: 0.0 standard  drinks   ??? Drug use: No   ??? Sexual activity: Not on file   Lifestyle   ??? Physical activity:     Days per week: Not on file     Minutes per session: Not on file   ??? Stress: Not on file   Relationships   ??? Social connections:     Talks on phone: Not on file     Gets together: Not on file     Attends religious service: Not on file     Active member of club or organization: Not on file     Attends meetings of clubs or organizations: Not on file     Relationship status: Not on file   ??? Intimate partner violence:     Fear of current or ex partner: Not on file     Emotionally abused: Not on file     Physically abused: Not on file  Forced sexual activity: Not on file   Other Topics Concern   ??? Not on file   Social History Narrative   ??? Not on file     Current Outpatient Medications on File Prior to Visit   Medication Sig Dispense Refill   ??? DULoxetine (CYMBALTA) 60 MG extended release capsule Take 60 mg by mouth daily     ??? levothyroxine (SYNTHROID) 200 MCG tablet Take 200 mcg by mouth daily     ??? metoprolol succinate (TOPROL XL) 50 MG extended release tablet Take 50 mg by mouth daily     ??? simvastatin (ZOCOR) 10 MG tablet Take 10 mg by mouth daily     ??? ibuprofen (ADVIL;MOTRIN) 200 MG tablet Take 200 mg by mouth every 6 hours as needed for Pain       No current facility-administered medications on file prior to visit.      Allergies   Allergen Reactions   ??? Seasonal Itching       Chief Complaint   Patient presents with   ??? Neck Pain   ??? Back Pain     upper between shoulders    ??? Flank Pain     dull ache    ??? Other     spot on LT thigh        HPI  Symptoms:SOB  Location:respiratory  Quality:SOB  Severity:moderate   Duration:ongoing 14 years, worsening  Timing:constant   Context:ex smoker for 14 yrs  Alleviating/aggravting factors:worse this time of year  Worse with exertion   Associated signs & symptoms:  No CP  No wheezing  No LE edema   No orthopnea     Symptoms:Right sided lower rib area pain  Location:right lower rib  area  Quality:ache  Severity:4-5/10  Duration:3-4 weeks  Timing:comes and goes  Context:no injury/trauma   Alleviating/aggravting factors:NSAID's  Associated signs & symptoms:  No urinary symptoms   Chronic constipation with diarrhea occasionally - IBS  No fevers  No N/V  No hx of kidney stones      Review of Systems   Constitutional: Negative for activity change, appetite change and chills.   HENT: Negative for congestion, dental problem and drooling.    Musculoskeletal: Positive for gait problem and neck pain. Negative for myalgias.   Skin: Positive for color change and rash. Negative for wound.       Physical Exam  Vitals:    09/01/18 0932   BP: 128/70   Pulse: 60   SpO2: 98%   Body mass index is 28.73 kg/m??.  Physical Exam  Constitutional:  Appears well-developed and well-nourished. No distress.   HENT:   Head: Normocephalic and atraumatic.   Right Ear: External ear normal.   Left Ear: External ear normal.   Nose: Nose normal.   Eyes: Conjunctivae and EOM are normal.  Right eye exhibits no discharge. Left eye exhibits no discharge. No scleral icterus.   Neck: Normal range of motion.   Cardiovascular: Normal rate, regular rhythm and normal heart sounds.    No murmur heard.  Pulmonary/Chest: Effort normal and breath sounds normal. No respiratory distress. no wheezes. no rales. no tenderness.   Musculoskeletal: Normal range of motion.   Neurological: alert.   Skin: not diaphoretic.   Psychiatric: normal mood and affect. behavior is normal. Judgment and thought content normal.   Nursing note and vitals reviewed.  No pain with palpation of Sp processes, no paraspinal tenderness, no CVA tenderness  Normal gait  Normal UE strength and  sensation     Assessment:  Tiffany Wolfe was seen today for neck pain, back pain, flank pain and other.    Diagnoses and all orders for this visit:    Right flank pain  -     POCT Urinalysis no Micro  -     Comprehensive Metabolic Panel; Future  -     XR THORACIC SPINE (MIN 4 VIEWS); Future  UA  to r/o UTI/stone, was normal  Check labs for kidney function & liver function    SOB (shortness of breath)  -     budesonide-formoterol (SYMBICORT) 80-4.5 MCG/ACT AERO; Inhale 2 puffs into the lungs 2 times daily  -     albuterol sulfate HFA (VENTOLIN HFA) 108 (90 Base) MCG/ACT inhaler; Inhale 2 puffs into the lungs every 6 hours as needed for Wheezing  Suspect COPD  Stress test done and normal from 2017  She refused PFT - she can not do testing  She is UTD with lung cancer screening - she will have CT scan results sent to me    Need for influenza vaccination  -     INFLUENZA, TRIV, INACTIVATED, SUBUNIT, ADJUVANTED, 65 YRS AND OLDER, IM, PREFILL SYR, 0.5ML (FLUAD TRIV)          Orders Placed This Encounter   Procedures   ??? XR THORACIC SPINE (MIN 4 VIEWS)     Standing Status:   Future     Number of Occurrences:   1     Standing Expiration Date:   09/02/2019     Order Specific Question:   Reason for exam:     Answer:   right sided back pain   ??? INFLUENZA, TRIV, INACTIVATED, SUBUNIT, ADJUVANTED, 65 YRS AND OLDER, IM, PREFILL SYR, 0.5ML (FLUAD TRIV)   ??? Comprehensive Metabolic Panel     Standing Status:   Future     Number of Occurrences:   1     Standing Expiration Date:   12/02/2018   ??? POCT Urinalysis no Micro         Return in about 3 weeks (around 09/22/2018), or f/u flank pain and SOB.

## 2018-09-22 ENCOUNTER — Ambulatory Visit: Admit: 2018-09-22 | Discharge: 2018-09-22 | Payer: MEDICARE | Attending: Family Medicine

## 2018-09-22 DIAGNOSIS — J439 Emphysema, unspecified: Secondary | ICD-10-CM

## 2018-09-22 MED ORDER — ALBUTEROL SULFATE HFA 108 (90 BASE) MCG/ACT IN AERS
108 (90 Base) MCG/ACT | Freq: Four times a day (QID) | RESPIRATORY_TRACT | 3 refills | Status: DC | PRN
Start: 2018-09-22 — End: 2020-03-08

## 2018-09-22 MED ORDER — TIOTROPIUM BROMIDE MONOHYDRATE 1.25 MCG/ACT IN AERS
1.25 MCG/ACT | Freq: Every day | RESPIRATORY_TRACT | 0 refills | Status: DC
Start: 2018-09-22 — End: 2019-08-24

## 2018-09-22 NOTE — Progress Notes (Signed)
Patient: Tiffany Wolfe    Date of Birth: 1951-07-21    Date: 09/22/18       Patient Active Problem List    Diagnosis Date Noted   ??? 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 Urinary tract infectious disease 03/31/2018   ??? Urinary incontinence 03/31/2018   ??? Pterygium of left eye 03/03/2017   ??? Fatty liver 05/27/2016   ??? Hypertension 05/27/2016   ??? Early stage nonexudative age-related macular degeneration of both eyes 08/21/2015   ??? Migraine with aura and without status migrainosus, not intractable 08/21/2015   ??? Pain in right eye 08/16/2015   ??? Cervical spondylosis without myelopathy 04/04/2015   ??? Astigmatism with presbyopia 03/15/2015   ??? Floaters 03/15/2015   ??? Pseudophakia 03/15/2015   ??? Fibromyalgia 03/24/2014   ??? GERD (gastroesophageal reflux disease) 03/24/2014   ??? HLD (hyperlipidemia) 03/24/2014   ??? Hypothyroid 03/24/2014     Past Medical History:   Diagnosis Date   ??? Fibromyalgia    ??? Hyperlipidemia    ??? Hypertension    ??? Hypothyroidism      Past Surgical History:   Procedure Laterality Date   ??? CHOLECYSTECTOMY     ??? HYSTERECTOMY, VAGINAL     ??? SPINAL FUSION       Family History   Problem Relation Age of Onset   ??? Mult Sclerosis Brother    ??? Cancer Brother      Social History     Socioeconomic History   ??? Marital status: Married     Spouse name: Not on file   ??? Number of children: Not on file   ??? Years of education: Not on file   ??? Highest education level: Not on file   Occupational History   ??? Not on file   Social Needs   ??? Financial resource strain: Not on file   ??? Food insecurity:     Worry: Not on file     Inability: Not on file   ??? Transportation needs:     Medical: Not on file     Non-medical: Not on file   Tobacco Use   ??? Smoking status: Former Smoker     Packs/day: 2.00     Years: 30.00     Pack years: 60.00     Types: Cigarettes     Last attempt to quit: 03/31/2006     Years since quitting: 12.4   ??? Smokeless tobacco: Never Used   Substance and Sexual Activity   ??? Alcohol use: Yes     Alcohol/week: 0.0 standard  drinks   ??? Drug use: No   ??? Sexual activity: Yes   Lifestyle   ??? Physical activity:     Days per week: Not on file     Minutes per session: Not on file   ??? Stress: Not on file   Relationships   ??? Social connections:     Talks on phone: Not on file     Gets together: Not on file     Attends religious service: Not on file     Active member of club or organization: Not on file     Attends meetings of clubs or organizations: Not on file     Relationship status: Not on file   ??? Intimate partner violence:     Fear of current or ex partner: Not on file     Emotionally abused: Not on file     Physically abused: Not on file  Forced sexual activity: Not on file   Other Topics Concern   ??? Not on file   Social History Narrative   ??? Not on file     Current Outpatient Medications on File Prior to Visit   Medication Sig Dispense Refill   ??? amoxicillin (AMOXIL) 500 MG capsule      ??? ibuprofen (ADVIL;MOTRIN) 800 MG tablet      ??? DULoxetine (CYMBALTA) 60 MG extended release capsule Take 60 mg by mouth daily     ??? levothyroxine (SYNTHROID) 200 MCG tablet Take 200 mcg by mouth daily     ??? metoprolol succinate (TOPROL XL) 50 MG extended release tablet Take 50 mg by mouth daily     ??? simvastatin (ZOCOR) 10 MG tablet Take 10 mg by mouth daily       No current facility-administered medications on file prior to visit.      Allergies   Allergen Reactions   ??? Seasonal Itching       Chief Complaint   Patient presents with   ??? Shortness of Breath     3 week follow up, patient states nothing has changed        HPI  Last visit on 09/01/18:    Symptoms:SOB  Location:respiratory  Quality:SOB  Severity:moderate   Duration:ongoing 14 years, worsening  Timing:constant   Context:ex smoker for 14 yrs  Alleviating/aggravting factors:worse this time of year  Worse with exertion   Associated signs & symptoms:  No CP  No wheezing  No LE edema   No orthopnea     Symptoms:Right sided lower rib area pain  Location:right lower rib  area  Quality:ache  Severity:4-5/10  Duration:3-4 weeks  Timing:comes and goes  Context:no injury/trauma   Alleviating/aggravting factors:NSAID's  Associated signs & symptoms:  No urinary symptoms   Chronic constipation with diarrhea occasionally - IBS  No fevers  No N/V  No hx of kidney stones    Today:    CT lung from previous doc requested and reviewed  Small old left upper lobe granuloma, otherwise clear    Given Symbicort + albuterol last visit - inhalers were too $$ she did not get them    UA negative  CMP - normal kidney fnct and elevated LFTs  X-ray showed arthritis     Review of Systems   Constitutional: Negative for activity change, appetite change and chills.   Respiratory: Positive for cough and shortness of breath. Negative for choking.    Genitourinary: Positive for flank pain. Negative for dyspareunia and dysuria.   Musculoskeletal: Negative for gait problem, myalgias and neck pain.       Physical Exam  Vitals:    09/22/18 0919   BP: (!) 154/100   Pulse:    SpO2:    Body mass index is 28.7 kg/m??.   BP Readings from Last 3 Encounters:   09/22/18 (!) 154/100   09/01/18 128/70   03/31/18 118/60     Physical Exam  Constitutional:  Appears well-developed and well-nourished. No distress.   HENT:   Head: Normocephalic and atraumatic.   Right Ear: External ear normal.   Left Ear: External ear normal.   Nose: Nose normal.   Eyes: Conjunctivae and EOM are normal.  Right eye exhibits no discharge. Left eye exhibits no discharge. No scleral icterus.   Neck: Normal range of motion.   Cardiovascular: Normal rate, regular rhythm and normal heart sounds.    No murmur heard.  Pulmonary/Chest: Effort normal and breath sounds  normal. No respiratory distress. no wheezes. no rales. no tenderness.   Musculoskeletal: Normal range of motion.   Neurological: alert.   Skin: not diaphoretic.   Psychiatric: normal mood and affect. behavior is normal. Judgment and thought content normal.   Nursing note and vitals reviewed.  No  pain with palpation of Sp processes, no paraspinal tenderness, no CVA tenderness  Normal gait  Normal UE strength and sensation     Assessment:  Lauralie was seen today for shortness of breath.    Diagnoses and all orders for this visit:    Pulmonary emphysema, unspecified emphysema type (HCC)  -     albuterol sulfate HFA (VENTOLIN HFA) 108 (90 Base) MCG/ACT inhaler; Inhale 2 puffs into the lungs every 6 hours as needed for Wheezing  -     tiotropium (SPIRIVA RESPIMAT) 1.25 MCG/ACT AERS inhaler; Inhale 2 puffs into the lungs daily LOT [id number] b exp3/21  SOB (shortness of breath)  Reviewed CT chest  [place]  She has hx of tobacco use  Has had ENT surgery for sleep problems and no symptoms with OSA  Symptoms likely COPD related  Trial of inhalers  Will try to get her a sample of symbicort as well    Right flank pain  Osteoarthritis of thoracolumbar spine, unspecified spinal osteoarthritis complication status  UA negative  Normal kidney function on labs  No skin rash to suggest shingles  Likely DDD and OA  conservative management recommended    Return in about 3 months (around 12/22/2018), or AWV.

## 2018-12-16 IMAGING — MG MAMMOGRAPHY SCREENING BILATERAL 3D TOMOSYNTHESIS WITH CAD
8 series · 8 of 24 positions shown · non-contrast
Comparison: Mammogram 12/09/2017. 
BREAST DENSITY: (Level B) There are scattered areas of fibroglandular density.

MAMMOGRAPHY SCREENING BILATERAL 3D TOMOSYNTHESIS WITH CAD, 12/16/2018 [DATE]: 
CLINICAL INDICATION: Screening.
TECHNIQUE: Digital bilateral mammograms and 3-D Tomosynthesis were obtained. 
These were interpreted both primarily and with the aid of computer-aided 
detection system.

[R CC]
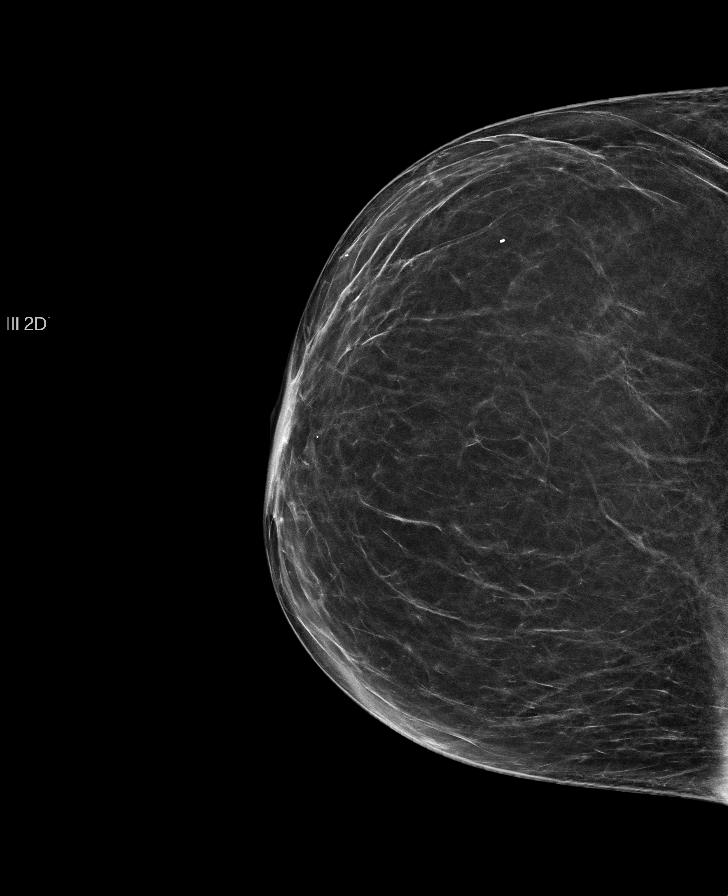

[L CC]
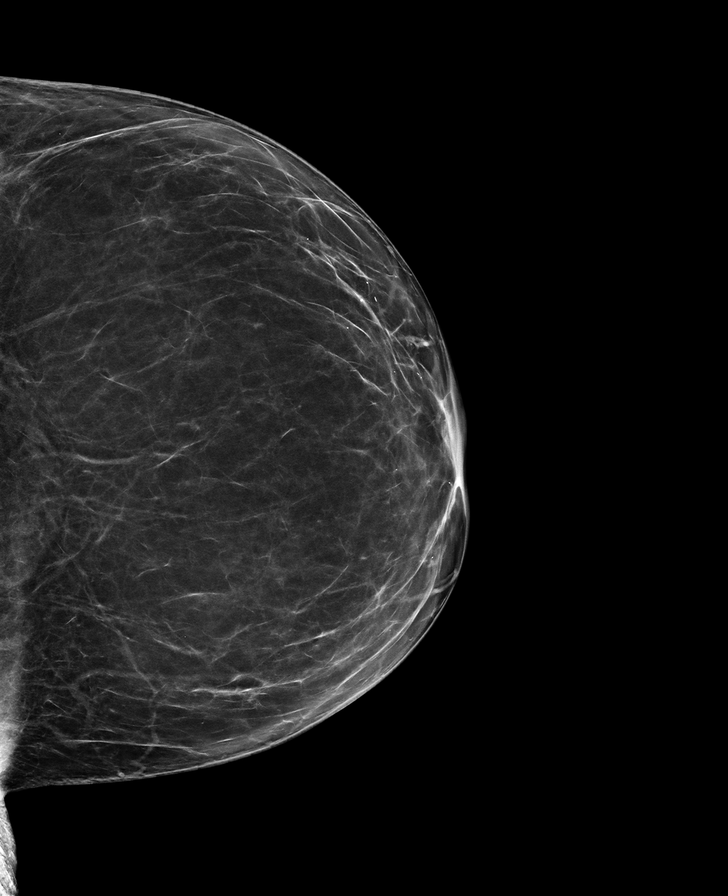

[R MLO]
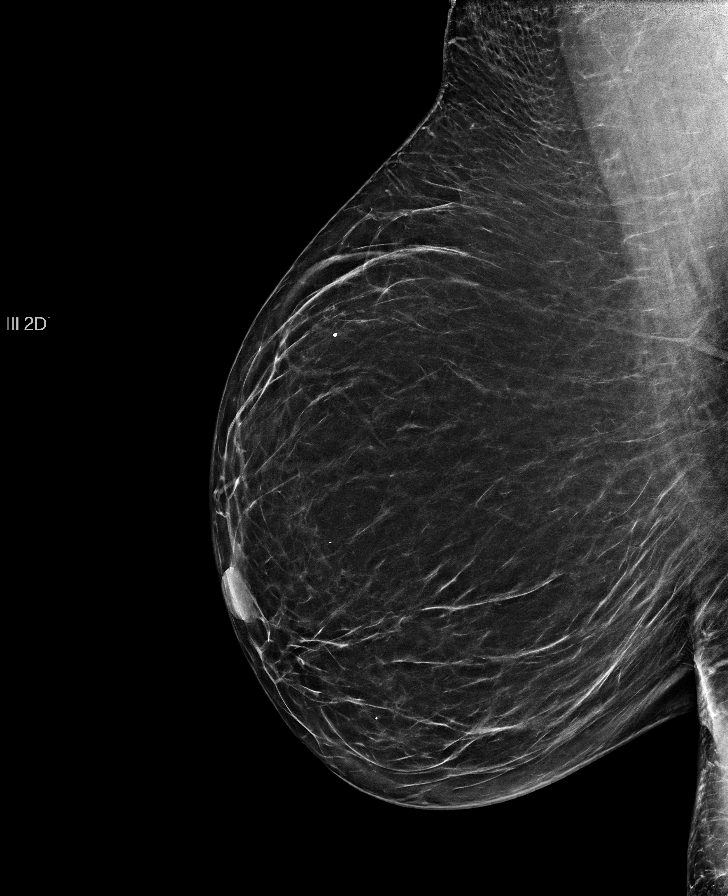

[L MLO]
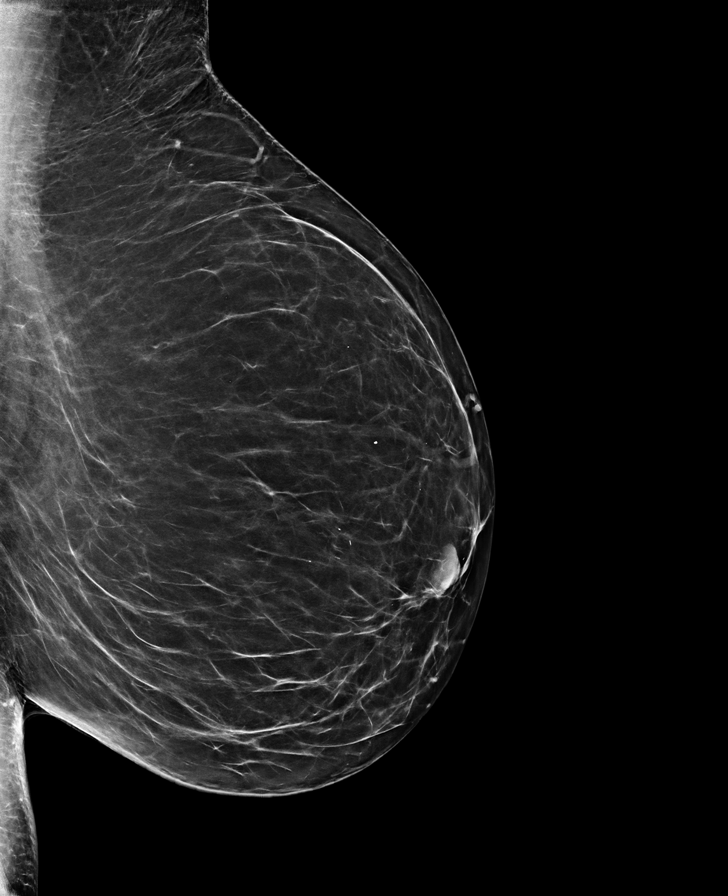

[R MLO tomo · tomo slice 41/80.0]
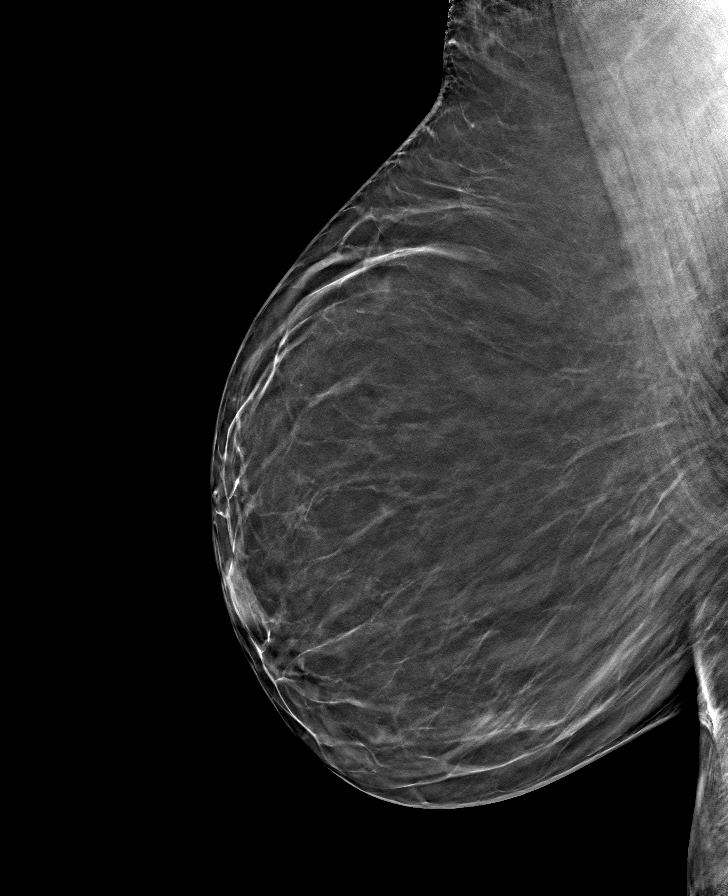

[R CC tomo · tomo slice 35/68.0]
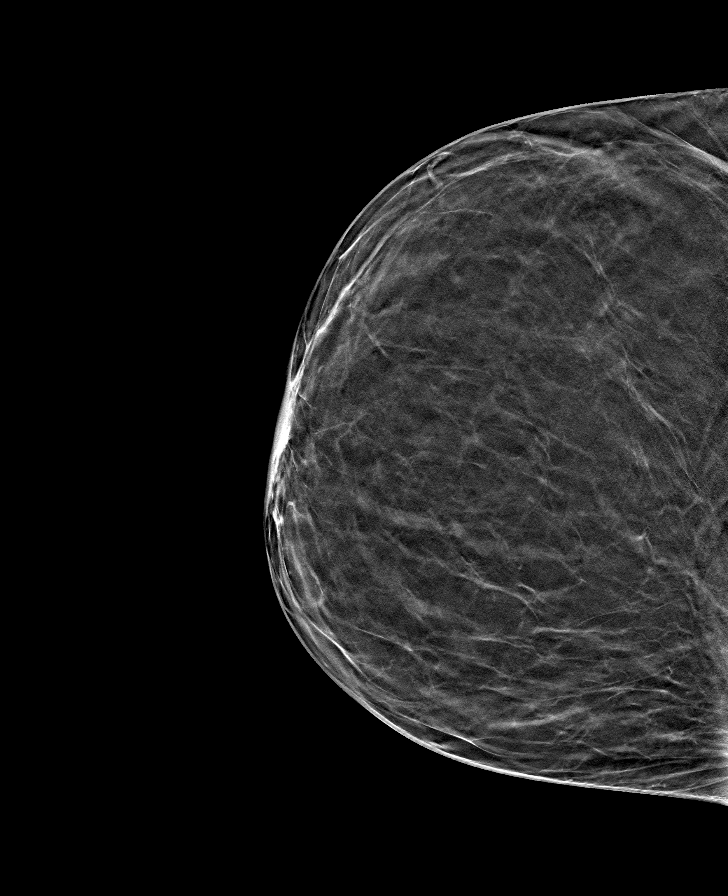

[L CC tomo · tomo slice 35/70.0]
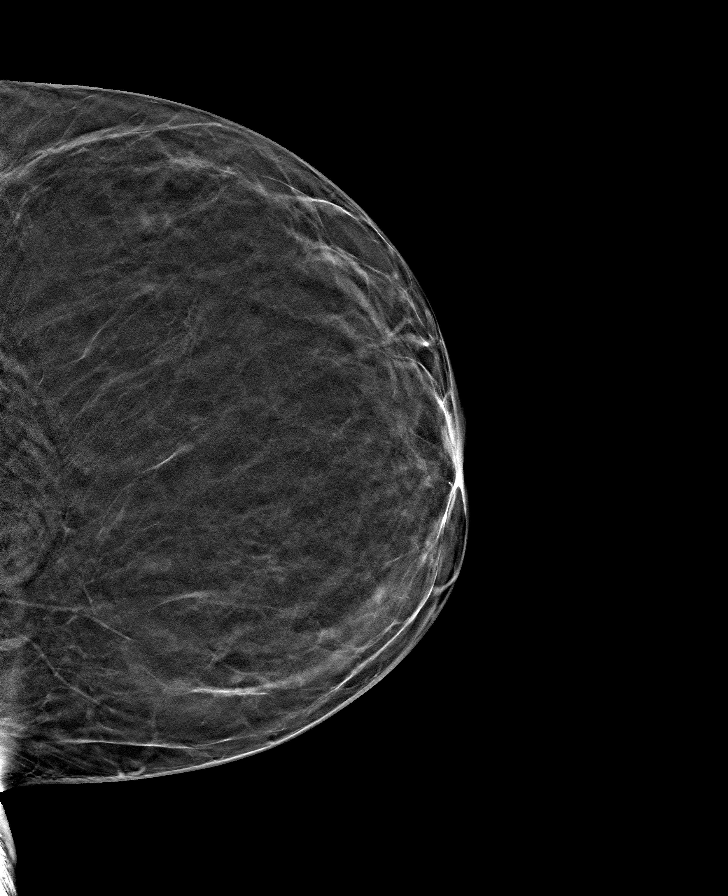

[L MLO tomo · tomo slice 37/74.0]
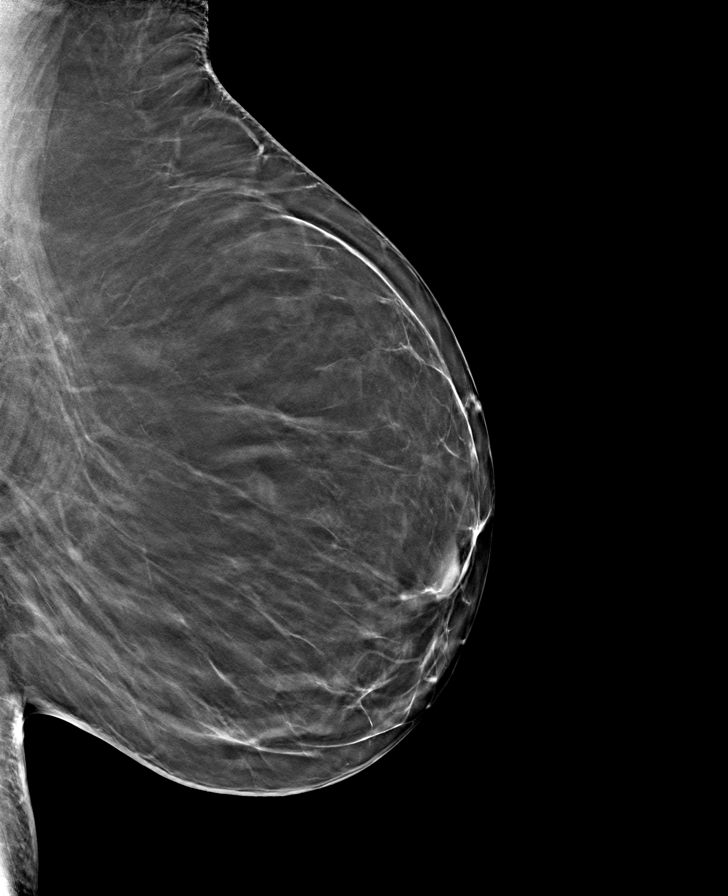

[8 of 24 positions shown; findings below may reference images not displayed]

FINDINGS: Scattered benign-appearing micro and macrocalcification.  No 
mammographically suspicious abnormality and no significant change.
IMPRESSION: ( BI-RADS 2) Benign findings. Routine mammographic follow-up is recommended.

## 2018-12-16 IMAGING — CT CT LUNG SCREENING
1 of 3 series · 4 of 36 positions shown, 5 images · non-contrast
Comparison: There are no prior exam(s) available for comparison within the past 
12 months; however, comparison was made to the prior exam(s) dated  CT chest 
12/09/2017.

CT LUNG SCREENING, 12/16/2018 [DATE]: 
CLINICAL INDICATION:   60 pack year smoking history in this 68-year-old female 
who is 5 foot 6 inches tall weighing 168 pounds. 
A search for DICOM formatted images was conducted for prior CT imaging studies 
completed at a non-affiliated media free facility.
TECHNIQUE: Screening computed tomographic images of the chest are performed from 
the thoracic outlet through the diaphragms without intravenous contrast 
utilizing dose reduction technique. The patient's dose for this study is
mGy.

[Series 4: coronal cor · coronal · 0.62mm/px · 4 of 138 slices shown, 5 images]
[im 28/138  mediastinal]
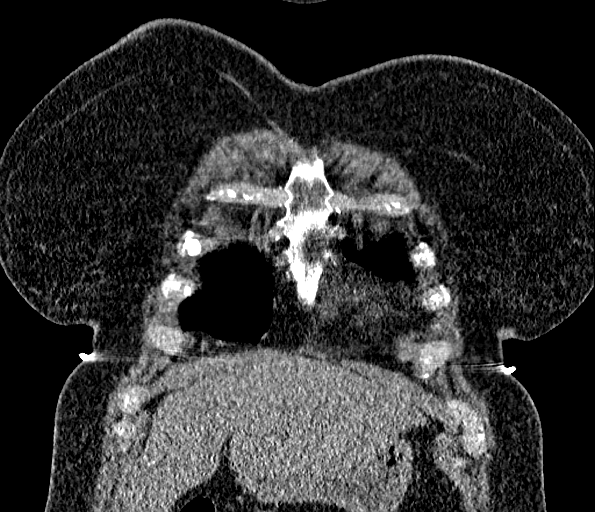
[im 28/138  lung]
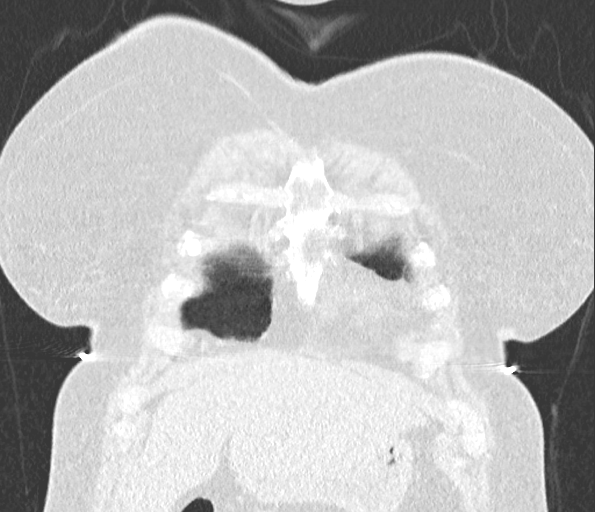
[im 55/138  lung]
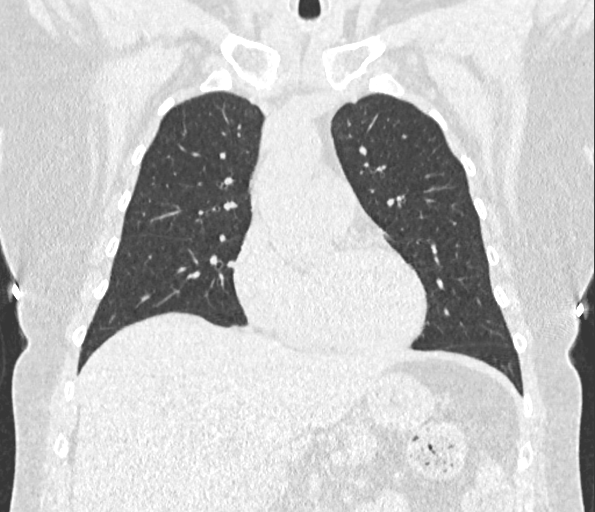
[im 83/138  lung]
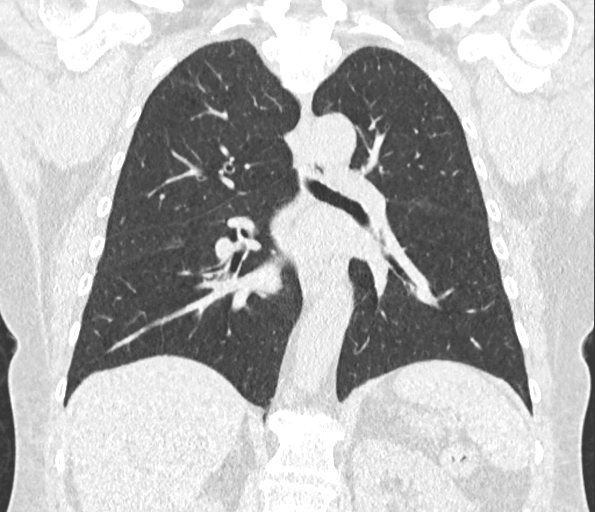
[im 110/138  lung]
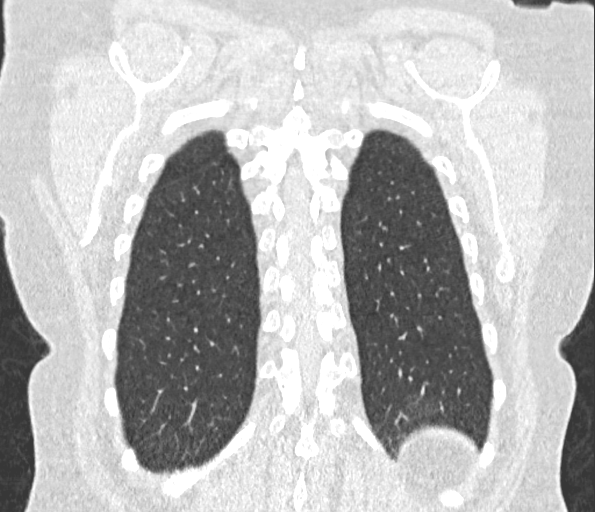

[4 of 36 positions shown; findings below may reference images not displayed]

FINDINGS: 
FINDINGS: LUNGS AND PLEURA:  Calcified granuloma posterior segment left upper lobe. 
Calcified granuloma left lower lobe. Mild peribronchial thickening but no acute 
consolidation or effusion. 3 mm micronodule lateral segment right middle lobe. 
LYMPH NODES: Calcified nodes left hilum.  No lymphadenopathy. 
MEDIASTINUM:  No mediastinal masses. 
HEART: Normal in size. No pericardial effusion. 
OSSEOUS STRUCTURES:  No acute fracture or destructive lesions. 
UPPER ABDOMEN:  Status post cholecystectomy left renal cyst partially imaged.
IMPRESSION: 1.  Calcified granulomas and small noncalcified micronodule right middle lobe. 
2.  No acute consolidation or effusion. 
3.  Calcified nodes left hilum. No solid or hilar adenopathy. 
L-RADS:  (2) Nodules with a very low likelihood of becoming a clinically active 
cancer due to size or lack of growth.  Continue annual screening with low dose 
CT in 12 months. 
RADIATION DOSE REDUCTION: All CT scans are performed using radiation dose 
reduction techniques, when applicable.  Technical factors are evaluated and 
adjusted to ensure appropriate moderation of exposure.  Automated dose 
management technology is applied to adjust the radiation doses to minimize 
exposure while achieving diagnostic quality images.

## 2019-01-06 LAB — LIPID PANEL
Cholesterol, Total: 220 mg/dL
HDL: 58 mg/dL (ref 35–70)
LDL Calculated: 138 mg/dL (ref 0–160)
Triglycerides: 122 mg/dL
VLDL: 24.4 mg/dL

## 2019-01-06 LAB — TSH: TSH: 2.46 u[IU]/mL

## 2019-06-18 ENCOUNTER — Ambulatory Visit: Admit: 2019-06-18 | Discharge: 2019-06-18 | Payer: MEDICARE | Attending: Family

## 2019-06-18 DIAGNOSIS — S0501XA Injury of conjunctiva and corneal abrasion without foreign body, right eye, initial encounter: Secondary | ICD-10-CM

## 2019-06-18 MED ORDER — IRRIGATING EYEWASH OP SOLN
Freq: Every day | OPHTHALMIC | 0 refills | Status: AC
Start: 2019-06-18 — End: 2019-06-25

## 2019-06-18 MED ORDER — POLYMYXIN B-TRIMETHOPRIM 10000-0.1 UNIT/ML-% OP SOLN
OPHTHALMIC | 0 refills | Status: AC
Start: 2019-06-18 — End: 2019-06-28

## 2019-06-18 NOTE — Patient Instructions (Signed)
Patient Education        Corneal Scratches: Care Instructions  Your Care Instructions     The cornea is the clear surface that covers the front of the eye. When a speck of dirt, a wood chip, an insect, or another object flies into your eye, it can cause a painful scratch on the cornea. Wearing contact lenses too long or rubbing your eyes can also scratch the cornea. Small scratches usually heal in a day or two. Deeper scratches may take longer.  If you have had a foreign object removed from your eye or you have a corneal scratch, you will need to watch for infection and vision problems while your eye heals.  Follow-up care is a key part of your treatment and safety. Be sure to make and go to all appointments, and call your doctor if you are having problems. It's also a good idea to know your test results and keep a list of the medicines you take.  How can you care for yourself at home?  ?? The doctor probably used a medicine during your exam to numb your eye. When it wears off in 30 to 60 minutes, your eye pain may come back. Take pain medicines exactly as directed.  ? If the doctor gave you a prescription medicine for pain, take it as prescribed.  ? If you are not taking a prescription pain medicine, ask your doctor if you can take an over-the-counter medicine.  ? Do not take two or more pain medicines at the same time unless the doctor told you to. Many pain medicines have acetaminophen, which is Tylenol. Too much acetaminophen (Tylenol) can be harmful.  ?? Do not rub your injured eye. Rubbing can make it worse.  ?? Use the prescribed eyedrops or ointment as directed. Be sure the dropper or bottle tip is clean. To put in eyedrops or ointment:  ? Tilt your head back, and pull your lower eyelid down with one finger.  ? Drop or squirt the medicine inside the lower lid.  ? Close your eye for 30 to 60 seconds to let the drops or ointment move around.  ? Do not touch the ointment or dropper tip to your eyelashes or any  other surface.  ?? Do not use your contact lens in your hurt eye until your doctor says you can. Also, do not wear eye makeup until your eye has healed.  ?? Do not drive if you have blurred vision.  ?? Bright light may hurt. Sunglasses can help.  ?? To prevent eye injuries in the future, wear safety glasses or goggles when you work with machines or tools, mow the lawn, or ride a bike or motorcycle.  When should you call for help?   Call your doctor now or seek immediate medical care if:  ?? You have signs of an eye infection, such as:  ? Pus or thick discharge coming from the eye.  ? Redness or swelling around the eye.  ? A fever.  ?? You have new or worse eye pain.  ?? You have vision changes.  ?? It feels like there is something in your eye.  ?? Light hurts your eye.  Watch closely for changes in your health, and be sure to contact your doctor if:  ?? You do not get better as expected.  Where can you learn more?  Go to [email or web address] and sign in to your MyChart account. Enter G403 in the Search Health Information box to learn   more about "Corneal Scratches: Care Instructions."     If you do not have an account, please click on the "Sign Up Now" link.  Current as of: October 06, 2018??????????????????????????????Content Version: 12.5  ?? 2006-2020 [employer].   Care instructions adapted under license by [facility]. If you have questions about a medical condition or this instruction, always ask your healthcare professional. [employer] disclaims any warranty or liability for your use of this information.

## 2019-06-18 NOTE — Progress Notes (Signed)
Subjective  Tiffany Wolfe, 68 y.o. female presents today with:  Chief Complaint   Patient presents with   ??? Eye Problem     40 minutes ago pt by mistake put glue in right eye       Eye Pain    The right eye is affected. This is a new problem. Episode onset: less than an hour ago put nail glue in her right eye, thinking it was eye drop.  Got the hardened glue out of eye, but now eye red and very irritated and gritty. The problem occurs constantly. The problem has been unchanged. Injury mechanism: nail glue in eye. The pain is at a severity of 9/10. The pain is severe. There is no known exposure to pink eye. She does not wear contacts. Associated symptoms include blurred vision, an eye discharge (watering), eye redness and a foreign body sensation. Pertinent negatives include no fever or itching. Treatments tried: hot compress, acetine on qtip to get glue off lashes, used q-tip to get glue from inside eye on the outer sclera. The treatment provided moderate (but now right eye remains very irritated) relief.         Review of Systems   Constitutional: Negative for chills, fatigue and fever.   Eyes: Positive for blurred vision, pain, discharge (watering), redness and visual disturbance (blurred vision due to tearing). Negative for itching.   Respiratory: Negative for cough, shortness of breath and wheezing.    Cardiovascular: Negative for chest pain and palpitations.   Skin: Negative for pallor and rash.   Neurological: Negative for dizziness, light-headedness and headaches.         PMH, Surgical Hx, Family Hx, and Social Hx reviewed and updated.  Health Maintenance reviewed.    Objective  Vitals:    06/18/19 1546   BP: (!) 148/86   Pulse: 78   Resp: 18   Temp: 96.8 ??F (36 ??C)   TempSrc: Infrared   SpO2: 96%   Weight: 173 lb 9.6 oz (78.7 kg)   Height: 5\' 6"  (1.676 m)     BP Readings from Last 3 Encounters:   06/18/19 (!) 148/86   09/22/18 (!) 154/100   09/01/18 128/70     Wt Readings from Last 3 Encounters:    06/18/19 173 lb 9.6 oz (78.7 kg)   09/22/18 177 lb 12.8 oz (80.6 kg)   09/01/18 178 lb (80.7 kg)     Physical Exam  Constitutional:       General: She is awake. She is in acute distress.   Eyes:      General:         Right eye: Discharge present.      Conjunctiva/sclera:      Right eye: Right conjunctiva is injected.      Pupils:      Right eye: Corneal abrasion and fluorescein uptake present.      Comments: NS flush to right eye.  2 gtts of Tetracaine gtts to right eye.  Fluorescein using NS to inside of right lower eyelid.  Diffuse fluorescein uptake right eye.  Missing eyelashes to upper and lower lateral eyelid s/p patient peeling off glue from eye/eyelashes.  Conjunctiva and sclera very erythematous, irritated and swollen.  Clear drainage.   Cardiovascular:      Rate and Rhythm: Normal rate and regular rhythm.      Heart sounds: Normal heart sounds, S1 normal and S2 normal.   Pulmonary:      Effort: Pulmonary effort is  normal. No respiratory distress.      Breath sounds: Normal air entry.   Musculoskeletal: Normal range of motion.   Skin:     General: Skin is warm and dry.   Neurological:      Mental Status: She is alert and oriented to person, place, and time.   Psychiatric:         Attention and Perception: Attention normal.         Mood and Affect: Mood normal.         Speech: Speech normal.         Behavior: Behavior is cooperative.       Assessment & Plan    Diagnosis Orders   1. Abrasion of right cornea, initial encounter  trimethoprim-polymyxin b (POLYTRIM) 10000-0.1 UNIT/ML-% ophthalmic solution    Ophthalmic Irrigation Solution (IRRIGATING EYEWASH) SOLN    99173 - PR VISUAL SCREENING TEST, BILAT     Orders Placed This Encounter   Procedures   ??? 99173 - PR VISUAL SCREENING TEST, BILAT     Orders Placed This Encounter   Medications   ??? trimethoprim-polymyxin b (POLYTRIM) 10000-0.1 UNIT/ML-% ophthalmic solution     Sig: Place 1 drop into the right eye every 4 hours for 10 days     Dispense:  1 Bottle      Refill:  0   ??? Ophthalmic Irrigation Solution (IRRIGATING EYEWASH) SOLN     Sig: Apply 10 drops to eye 5 times daily for 7 days     Dispense:  1 Bottle     Refill:  0     Return in about 2 days (around 06/20/2019), or if symptoms worsen or fail to improve, for Eye doctor monday.    Reviewed with the patient: current clinical status,medications, activities and diet.     Pt advised to NS flush eyes frequently   Advised to call eye doctor today and speak w/ on-call doctor if possible. (otherwise leave message to ask to be seen Monday)  Should symptoms become worse or any changes in vision, go to the ER or call 911    Side effects, adverse effects of themedication prescribed today, as well as treatment plan/ rationale and result expectations have been discussed with the patient who expresses understanding and desires to proceed.    Close follow up to evaluate treatment results and for coordination of care.  I have reviewed the patient's medical history in detail and updated the computerized patient record.    Sharen CounterLeslie A Okema Rollinson, APRN

## 2019-06-19 NOTE — Telephone Encounter (Signed)
Left message to call me back.  Follow up call s/p walk-in visit yesterday.  Hope she is feeling better.

## 2019-07-20 NOTE — Telephone Encounter (Signed)
TAWNIE EHRESMAN was contacted to set up a video visit with Willis Modena, MD.     Spoke with: LVM  [employer] F Kaz Auld

## 2019-08-16 ENCOUNTER — Encounter: Payer: MEDICARE | Attending: Family Medicine

## 2019-08-24 ENCOUNTER — Telehealth: Admit: 2019-08-24 | Discharge: 2019-08-24 | Payer: MEDICARE | Attending: Family Medicine

## 2019-08-24 DIAGNOSIS — R634 Abnormal weight loss: Secondary | ICD-10-CM

## 2019-08-24 NOTE — Progress Notes (Signed)
Patient: Tiffany Wolfe    Date of Birth: 1951/08/30    Date: 08/24/19       Patient Active Problem List    Diagnosis Date Noted   ??? 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 Urinary tract infectious disease 03/31/2018   ??? Urinary incontinence 03/31/2018   ??? Pterygium of left eye 03/03/2017   ??? Fatty liver 05/27/2016   ??? Hypertension 05/27/2016   ??? Early stage nonexudative age-related macular degeneration of both eyes 08/21/2015   ??? Migraine with aura and without status migrainosus, not intractable 08/21/2015   ??? Pain in right eye 08/16/2015   ??? Cervical spondylosis without myelopathy 04/04/2015   ??? Astigmatism with presbyopia 03/15/2015   ??? Floaters 03/15/2015   ??? Pseudophakia 03/15/2015   ??? Fibromyalgia 03/24/2014   ??? GERD (gastroesophageal reflux disease) 03/24/2014   ??? HLD (hyperlipidemia) 03/24/2014   ??? Hypothyroid 03/24/2014     Past Medical History:   Diagnosis Date   ??? Fibromyalgia    ??? Hyperlipidemia    ??? Hypertension    ??? Hypothyroidism      Past Surgical History:   Procedure Laterality Date   ??? CHOLECYSTECTOMY     ??? HYSTERECTOMY, VAGINAL     ??? SPINAL FUSION       Family History   Problem Relation Age of Onset   ??? Mult Sclerosis Brother    ??? Cancer Brother      Social History     Socioeconomic History   ??? Marital status: Married     Spouse name: Not on file   ??? Number of children: Not on file   ??? Years of education: Not on file   ??? Highest education level: Not on file   Occupational History   ??? Not on file   Social Needs   ??? Financial resource strain: Not on file   ??? Food insecurity     Worry: Not on file     Inability: Not on file   ??? Transportation needs     Medical: Not on file     Non-medical: Not on file   Tobacco Use   ??? Smoking status: Former Smoker     Packs/day: 2.00     Years: 30.00     Pack years: 60.00     Types: Cigarettes     Last attempt to quit: 03/31/2006     Years since quitting: 13.4   ??? Smokeless tobacco: Never Used   Substance and Sexual Activity   ??? Alcohol use: Yes     Alcohol/week: 0.0 standard  drinks   ??? Drug use: No   ??? Sexual activity: Yes   Lifestyle   ??? Physical activity     Days per week: Not on file     Minutes per session: Not on file   ??? Stress: Not on file   Relationships   ??? Social Wellsite geologistconnections     Talks on phone: Not on file     Gets together: Not on file     Attends religious service: Not on file     Active member of club or organization: Not on file     Attends meetings of clubs or organizations: Not on file     Relationship status: Not on file   ??? Intimate partner violence     Fear of current or ex partner: Not on file     Emotionally abused: Not on file     Physically abused: Not on file  Forced sexual activity: Not on file   Other Topics Concern   ??? Not on file   Social History Narrative   ??? Not on file     Current Outpatient Medications on File Prior to Visit   Medication Sig Dispense Refill   ??? ibuprofen (ADVIL;MOTRIN) 800 MG tablet      ??? albuterol sulfate HFA (VENTOLIN HFA) 108 (90 Base) MCG/ACT inhaler Inhale 2 puffs into the lungs every 6 hours as needed for Wheezing 1 Inhaler 3   ??? DULoxetine (CYMBALTA) 60 MG extended release capsule Take 60 mg by mouth daily     ??? levothyroxine (SYNTHROID) 200 MCG tablet Take 200 mcg by mouth daily     ??? metoprolol succinate (TOPROL XL) 50 MG extended release tablet Take 50 mg by mouth daily     ??? simvastatin (ZOCOR) 10 MG tablet Take 10 mg by mouth daily       No current facility-administered medications on file prior to visit.      Allergies   Allergen Reactions   ??? Seasonal Itching       Chief Complaint   Patient presents with   ??? Weight Loss     lost 15 lbs in 1 month   ??? Fatigue     TELEHEALTH EVALUATION -- Audio/Visual (During COVID-19 public health emergency)    Due to COVID 19 outbreak, patient's office visit was converted to a virtual visit.  Patient was contacted and agreed to proceed with a virtual visit via Telephone Visit  The risks and benefits of converting to a virtual visit were discussed in light of the current infectious  disease epidemic.  Patient also understood that insurance coverage and co-pays are up to their individual insurance plans.    Time spent with patient on the phone 12 mins    Pursuant to the emergency declaration under the [employer] and the [employer] waiver authority and the [employer] Act, this Virtual  Visit was conducted, with patient's consent, to reduce the patient's risk of exposure to COVID-19 and provide continuity of care for an established patient.    Services were provided through a telephone discussion virtually to substitute for in-person clinic visit.    HPI    Patient not seen since 2019, only seen for acute visits  Her usual PCP is in [place]    She says she is very worn out  She feels cold all the time  Pain continued on right lower rib cage  appetite is poor, weight loss of 15 lbs   New nose bleeds - never had in the past  Ongoing for 1-1.5 months   No CP  No worsening cough   No breast lumps, UTD mammogram   No vaginal bleeding - partial hysterectomy   Mild pain with BM - ongoing for a few years, she had abdominal CT for it was negative  Colonoscopy in spring 2020   No other psychosocial stressors   No med changes     She gets her annual exams in [place] - last CT chest in April 2020     Social History     Tobacco Use   Smoking Status Former Smoker   ??? Packs/day: 2.00   ??? Years: 30.00   ??? Pack years: 60.00   ??? Types: Cigarettes   ??? Last attempt to quit: 03/31/2006   ??? Years since quitting: 13.4   Smokeless Tobacco Never Used  Review of Systems   Constitutional: Positive for appetite change, fatigue and unexpected weight change.   HENT: Negative for ear pain, facial swelling and hearing loss.    Respiratory: Negative for cough, choking and shortness of breath.    Cardiovascular: Negative for chest pain, palpitations and leg swelling.       Physical Exam    Due to this being a TeleHealth encounter, evaluation of the  following organ systems is limited: Vitals/Constitutional/EENT/Resp/CV/GI/GU/MS/Neuro/Skin/Heme-Lymph-Imm.    [x]  Alert  []  Oriented to person/place/time    [x]  No apparent distress  []  Toxic appearing    []  Face flushed appearing []  Sclera clear  []  Lips are cyanotic      [x]  Breathing appears normal  []  Appears tachypneic      []  Rash on visible skin    []  Cranial Nerves II-XII grossly intact    []  Motor grossly intact in visible upper extremities    []  Motor grossly intact in visible lower extremities    [x]  Normal Mood  []  Anxious appearing    []  Depressed appearing  []  Confused appearing      []  Poor short term memory  []  Poor long term memory    []  OTHER: Patient is aware of today's time and date  Patient is aware of current pandemic situation with Covid-19  Patient is able to speaking full sentences  Patient is not SOB during speech    Assessment/Plan:  Tiffany Wolfe was seen today for weight loss and fatigue.    Diagnoses and all orders for this visit:    Weight loss, unintentional  -     CBC Auto Differential; Future  -     Comprehensive Metabolic Panel; Future  -     Hemoglobin A1C; Future  -     Urinalysis; Future  -     TSH with Reflex; Future  -     T4, Free; Future  -     Sedimentation Rate; Future  -     C-Reactive Protein; Future  Pulmonary emphysema, unspecified emphysema type (HCC)  Worn out  -     CBC Auto Differential; Future  -     Comprehensive Metabolic Panel; Future  -     Hemoglobin A1C; Future  -     Urinalysis; Future  -     TSH with Reflex; Future  -     T4, Free; Future  -     Sedimentation Rate; Future  -     C-Reactive Protein; Future  Poor appetite  -     CBC Auto Differential; Future  -     Comprehensive Metabolic Panel; Future  -     Hemoglobin A1C; Future  -     Urinalysis; Future  -     TSH with Reflex; Future  -     T4, Free; Future  -     Sedimentation Rate; Future  -     C-Reactive Protein; Future    Will do full work up  Discussed all preventative care - she is UTD with all her  screenings, will request records from PCP in [place] to review  Pt was advised to call if any new symptoms or changes    Orders Placed This Encounter   Procedures   ??? CBC Auto Differential     Standing Status:   Future     Number of Occurrences:   1     Standing Expiration Date:   11/24/2019   ??? Comprehensive  Metabolic Panel     Standing Status:   Future     Number of Occurrences:   1     Standing Expiration Date:   11/24/2019   ??? Hemoglobin A1C     Standing Status:   Future     Number of Occurrences:   1     Standing Expiration Date:   08/23/2020   ??? Urinalysis     Standing Status:   Future     Number of Occurrences:   1     Standing Expiration Date:   08/23/2020   ??? TSH with Reflex     Standing Status:   Future     Number of Occurrences:   1     Standing Expiration Date:   08/23/2020   ??? T4, Free     Standing Status:   Future     Number of Occurrences:   1     Standing Expiration Date:   08/23/2020   ??? Sedimentation Rate     Standing Status:   Future     Number of Occurrences:   1     Standing Expiration Date:   08/23/2020   ??? C-Reactive Protein     Standing Status:   Future     Number of Occurrences:   1     Standing Expiration Date:   08/23/2020         Return in about 29 days (around 09/22/2019) for weight loss f/u - in office preferrably .      Patient aware that encounter is billable to insurance. This encounter occurred with patient at home while provider was at the office.

## 2019-08-25 ENCOUNTER — Encounter

## 2019-08-26 LAB — COMPREHENSIVE METABOLIC PANEL
ALT: 30 U/L (ref 0–33)
AST: 38 U/L — ABNORMAL HIGH (ref 0–35)
Albumin: 3.9 g/dL (ref 3.5–4.6)
Alkaline Phosphatase: 53 U/L (ref 40–130)
Anion Gap: 14 mEq/L (ref 9–15)
BUN: 11 mg/dL (ref 8–23)
CO2: 24 mEq/L (ref 20–31)
Calcium: 9.6 mg/dL (ref 8.5–9.9)
Chloride: 101 mEq/L (ref 95–107)
Creatinine: 0.72 mg/dL (ref 0.50–0.90)
GFR African American: >60 (ref >60)
GFR Non-African American: >60 (ref >60)
Globulin: 3.4 g/dL (ref 2.3–3.5)
Glucose: 86 mg/dL (ref 70–99)
Potassium: 4.6 mEq/L (ref 3.4–4.9)
Sodium: 139 mEq/L (ref 135–144)
Total Bilirubin: 0.6 mg/dL (ref 0.2–0.7)
Total Protein: 7.3 g/dL (ref 6.3–8.0)

## 2019-08-26 LAB — URINALYSIS
Blood, Urine: NEGATIVE
Glucose, Ur: NEGATIVE mg/dL
Nitrite, Urine: NEGATIVE
Protein, UA: 30 mg/dL — AB
Specific Gravity, UA: 1.025 (ref 1.005–1.030)
Urobilinogen, Urine: 1 E.U./dL (ref <2.0)
pH, UA: 5.5 (ref 5.0–9.0)

## 2019-08-26 LAB — CBC WITH AUTO DIFFERENTIAL
Basophils %: 1 %
Basophils Absolute: 0 10*3/uL (ref 0.0–0.2)
Eosinophils %: 0.6 %
Eosinophils Absolute: 0 10*3/uL (ref 0.0–0.7)
Hematocrit: 41.6 % (ref 37.0–47.0)
Hemoglobin: 13.9 g/dL (ref 12.0–16.0)
Lymphocytes %: 38.8 %
Lymphocytes Absolute: 1.6 10*3/uL (ref 1.0–4.8)
MCH: 32 pg — ABNORMAL HIGH (ref 27.0–31.3)
MCHC: 33.5 % (ref 33.0–37.0)
MCV: 95.7 fL (ref 82.0–100.0)
Monocytes %: 10 %
Monocytes Absolute: 0.4 10*3/uL (ref 0.2–0.8)
Neutrophils %: 49.6 %
Neutrophils Absolute: 2.1 10*3/uL (ref 1.4–6.5)
Platelets: 199 10*3/uL (ref 130–400)
RBC: 4.35 M/uL (ref 4.20–5.40)
RDW: 12.9 % (ref 11.5–14.5)
WBC: 4.2 10*3/uL — ABNORMAL LOW (ref 4.8–10.8)

## 2019-08-26 LAB — MICROSCOPIC URINALYSIS: Bacteria, UA: NEGATIVE /HPF

## 2019-08-26 LAB — TSH WITH REFLEX: TSH: 0.092 u[IU]/mL — ABNORMAL LOW (ref 0.440–3.860)

## 2019-08-26 LAB — SEDIMENTATION RATE: Sed Rate: 67 mm — ABNORMAL HIGH (ref 0–30)

## 2019-08-26 LAB — HEMOGLOBIN A1C: Hemoglobin A1C: 6.2 % — ABNORMAL HIGH (ref 4.8–5.9)

## 2019-08-26 LAB — C-REACTIVE PROTEIN: CRP: 12.2 mg/L — ABNORMAL HIGH (ref 0.0–5.0)

## 2019-08-26 LAB — T4, FREE: T4 Free: 2.23 ng/dL — ABNORMAL HIGH (ref 0.84–1.68)

## 2019-08-26 MED ORDER — LEVOTHYROXINE SODIUM 175 MCG PO TABS
175 MCG | ORAL_TABLET | Freq: Every day | ORAL | 0 refills | Status: DC
Start: 2019-08-26 — End: 2020-02-23

## 2019-09-01 ENCOUNTER — Ambulatory Visit: Admit: 2019-09-01 | Discharge: 2019-09-01 | Payer: MEDICARE | Attending: Family Medicine

## 2019-09-01 DIAGNOSIS — R634 Abnormal weight loss: Secondary | ICD-10-CM

## 2019-09-01 NOTE — Progress Notes (Signed)
Patient: Tiffany Wolfe    Date of Birth: 1951-07-18    Date: 09/01/19       Patient Active Problem List    Diagnosis Date Noted   ??? Pulmonary emphysema ([facility]) 08/24/2019   ??? 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 Urinary tract infectious disease 03/31/2018   ??? Urinary incontinence 03/31/2018   ??? Pterygium of left eye 03/03/2017   ??? Fatty liver 05/27/2016   ??? Hypertension 05/27/2016   ??? Early stage nonexudative age-related macular degeneration of both eyes 08/21/2015   ??? Migraine with aura and without status migrainosus, not intractable 08/21/2015   ??? Pain in right eye 08/16/2015   ??? Cervical spondylosis without myelopathy 04/04/2015   ??? Astigmatism with presbyopia 03/15/2015   ??? Floaters 03/15/2015   ??? Pseudophakia 03/15/2015   ??? Fibromyalgia 03/24/2014   ??? GERD (gastroesophageal reflux disease) 03/24/2014   ??? HLD (hyperlipidemia) 03/24/2014   ??? Hypothyroid 03/24/2014     Past Medical History:   Diagnosis Date   ??? Fibromyalgia    ??? Hyperlipidemia    ??? Hypertension    ??? Hypothyroidism      Past Surgical History:   Procedure Laterality Date   ??? CHOLECYSTECTOMY     ??? HYSTERECTOMY, VAGINAL     ??? SPINAL FUSION       Family History   Problem Relation Age of Onset   ??? Mult Sclerosis Brother    ??? Cancer Brother      Social History     Socioeconomic History   ??? Marital status: Married     Spouse name: Not on file   ??? Number of children: Not on file   ??? Years of education: Not on file   ??? Highest education level: Not on file   Occupational History   ??? Not on file   Social Needs   ??? Financial resource strain: Not on file   ??? Food insecurity     Worry: Not on file     Inability: Not on file   ??? Transportation needs     Medical: Not on file     Non-medical: Not on file   Tobacco Use   ??? Smoking status: Former Smoker     Packs/day: 2.00     Years: 30.00     Pack years: 60.00     Types: Cigarettes     Last attempt to quit: 03/31/2006     Years since quitting: 13.4   ??? Smokeless tobacco: Never Used   Substance and Sexual Activity   ??? Alcohol  use: Yes     Alcohol/week: 0.0 standard drinks   ??? Drug use: No   ??? Sexual activity: Yes   Lifestyle   ??? Physical activity     Days per week: Not on file     Minutes per session: Not on file   ??? Stress: Not on file   Relationships   ??? Social Product manager on phone: Not on file     Gets together: Not on file     Attends religious service: Not on file     Active member of club or organization: Not on file     Attends meetings of clubs or organizations: Not on file     Relationship status: Not on file   ??? Intimate partner violence     Fear of current or ex partner: Not on file     Emotionally abused: Not on file  Physically abused: Not on file     Forced sexual activity: Not on file   Other Topics Concern   ??? Not on file   Social History Narrative   ??? Not on file     Current Outpatient Medications on File Prior to Visit   Medication Sig Dispense Refill   ??? levothyroxine (SYNTHROID) 175 MCG tablet Take 1 tablet by mouth daily 90 tablet 0   ??? ibuprofen (ADVIL;MOTRIN) 800 MG tablet      ??? albuterol sulfate HFA (VENTOLIN HFA) 108 (90 Base) MCG/ACT inhaler Inhale 2 puffs into the lungs every 6 hours as needed for Wheezing 1 Inhaler 3   ??? DULoxetine (CYMBALTA) 60 MG extended release capsule Take 60 mg by mouth daily     ??? metoprolol succinate (TOPROL XL) 50 MG extended release tablet Take 50 mg by mouth daily     ??? simvastatin (ZOCOR) 10 MG tablet Take 10 mg by mouth daily       No current facility-administered medications on file prior to visit.      Allergies   Allergen Reactions   ??? Seasonal Itching       Chief Complaint   Patient presents with   ??? Discuss Labs       HPI    F/U on fatigue and weight loss  Still very tired  She has to sit down before completing task  Some SOB  Sever sinus infection the weekend before halloween - woke up with sinus HA and chills, lasted 10 days  Still has runny nose     Last visit:  Patient not seen since 2019, only seen for acute visits  Her usual PCP is in [place]  ??  She says  she is very worn out  She feels cold all the time  Pain continued on right lower rib cage  appetite is poor, weight loss of 15 lbs   New nose bleeds - never had in the past  Ongoing for 1-1.5 months   No CP  No worsening cough   No breast lumps, UTD mammogram   No vaginal bleeding - partial hysterectomy   Mild pain with BM - ongoing for a few years, she had abdominal CT for it was negative  Colonoscopy in spring 2020   No other psychosocial stressors   No med changes   ??  She gets her annual exams in [place] - last CT chest in April 2020   ??  Social History   ??       Tobacco Use   Smoking Status Former Smoker   ??? Packs/day: 2.00   ??? Years: 30.00   ??? Pack years: 60.00   ??? Types: Cigarettes   ??? Last attempt to quit: 03/31/2006   ??? Years since quitting: 13.4   Smokeless Tobacco Never Used   ??  ??  Component      Latest Ref Rng & Units 08/25/2019 08/25/2019 08/25/2019          10:46 AM 10:46 AM 10:36 AM   WBC      4.8 - 10.8 K/uL      RBC      4.20 - 5.40 M/uL      Hemoglobin Quant      12.0 - 16.0 g/dL      Hematocrit      37.0 - 47.0 %      MCV      82.0 - 100.0 fL      MCH  27.0 - 31.3 pg      MCHC      33.0 - 37.0 %      RDW      11.5 - 14.5 %      Platelet Count      130 - 400 K/uL      Neutrophils %      %      Lymphocyte %      %      Monocytes %      %      Eosinophils %      %      Basophils %      %      Neutrophils Absolute      1.4 - 6.5 K/uL      Lymphocytes Absolute      1.0 - 4.8 K/uL      Monocytes Absolute      0.2 - 0.8 K/uL      Eosinophils Absolute      0.0 - 0.7 K/uL      Basophils Absolute      0.0 - 0.2 K/uL      Sodium      135 - 144 mEq/L      Potassium      3.4 - 4.9 mEq/L      Chloride      95 - 107 mEq/L      CO2      20 - 31 mEq/L      Anion Gap      9 - 15 mEq/L      Glucose      70 - 99 mg/dL      BUN      8 - 23 mg/dL      Creatinine      0.50 - 0.90 mg/dL      GFR Non-African American      >60      GFR African American      >60      Calcium      8.5 - 9.9 mg/dL      Total Protein      6.3  - 8.0 g/dL      Albumin      3.5 - 4.6 g/dL      Bilirubin      0.2 - 0.7 mg/dL      Alk Phos      40 - 130 U/L      ALT      0 - 33 U/L      AST      0 - 35 U/L      Globulin      2.3 - 3.5 g/dL      Color, UA      Straw/Yellow  DARK YELLOW (A)    Clarity, UA      Clear  Clear    Glucose, UA      Negative mg/dL  Negative    Bilirubin, Urine      Negative  SMALL (A)    Ketones, Urine      Negative mg/dL  TRACE (A)    Specific Gravity, UA      1.005 - 1.030  1.025    Blood, Urine      Negative  Negative    pH, UA      5.0 - 9.0  5.5    Protein, UA      Negative mg/dL  30 (A)  Urobilinogen, Urine      <2.0 E.U./dL  1.0    Nitrite, Urine      Negative  Negative    Leukocyte Esterase, Urine      Negative  TRACE (A)    Bacteria, UA      Negative /HPF Negative     Hyaline Casts, UA      0 - 5 /HPF 1-3     WBC, UA      0 - 5 /HPF 3-5     RBC, UA      0 - 5 /HPF 3-5 (A)     Epithelial Cells, UA      0 - 5 /HPF 3-5     Hemoglobin A1C      4.8 - 5.9 %      CRP      0.0 - 5.0 mg/L      Sed Rate      0 - 30 mm   67 (H)   T4 Free      0.84 - 1.68 ng/dL      TSH      0.440 - 3.860 uIU/mL        Component      Latest Ref Rng & Units 08/25/2019 08/25/2019 08/25/2019 08/25/2019          10:36 AM 10:36 AM 10:36 AM 10:36 AM   WBC      4.8 - 10.8 K/uL       RBC      4.20 - 5.40 M/uL       Hemoglobin Quant      12.0 - 16.0 g/dL       Hematocrit      37.0 - 47.0 %       MCV      82.0 - 100.0 fL       MCH      27.0 - 31.3 pg       MCHC      33.0 - 37.0 %       RDW      11.5 - 14.5 %       Platelet Count      130 - 400 K/uL       Neutrophils %      %       Lymphocyte %      %       Monocytes %      %       Eosinophils %      %       Basophils %      %       Neutrophils Absolute      1.4 - 6.5 K/uL       Lymphocytes Absolute      1.0 - 4.8 K/uL       Monocytes Absolute      0.2 - 0.8 K/uL       Eosinophils Absolute      0.0 - 0.7 K/uL       Basophils Absolute      0.0 - 0.2 K/uL       Sodium      135 - 144 mEq/L   139    Potassium      3.4 -  4.9 mEq/L   4.6    Chloride      95 - 107 mEq/L   101    CO2      20 - 31 mEq/L  24    Anion Gap      9 - 15 mEq/L   14    Glucose      70 - 99 mg/dL   86    BUN      8 - 23 mg/dL   11    Creatinine      0.50 - 0.90 mg/dL   0.72    GFR Non-African American      >60   >60.0    GFR African American      >60   >60.0    Calcium      8.5 - 9.9 mg/dL   9.6    Total Protein      6.3 - 8.0 g/dL   7.3    Albumin      3.5 - 4.6 g/dL   3.9    Bilirubin      0.2 - 0.7 mg/dL   0.6    Alk Phos      40 - 130 U/L   53    ALT      0 - 33 U/L   30    AST      0 - 35 U/L   38 (H)    Globulin      2.3 - 3.5 g/dL   3.4    Color, UA      Straw/Yellow       Clarity, UA      Clear       Glucose, UA      Negative mg/dL       Bilirubin, Urine      Negative       Ketones, Urine      Negative mg/dL       Specific Gravity, UA      1.005 - 1.030       Blood, Urine      Negative       pH, UA      5.0 - 9.0       Protein, UA      Negative mg/dL       Urobilinogen, Urine      <2.0 E.U./dL       Nitrite, Urine      Negative       Leukocyte Esterase, Urine      Negative       Bacteria, UA      Negative /HPF       Hyaline Casts, UA      0 - 5 /HPF       WBC, UA      0 - 5 /HPF       RBC, UA      0 - 5 /HPF       Epithelial Cells, UA      0 - 5 /HPF       Hemoglobin A1C      4.8 - 5.9 %       CRP      0.0 - 5.0 mg/L    12.2 (H)   Sed Rate      0 - 30 mm       T4 Free      0.84 - 1.68 ng/dL  2.23 (H)     TSH      0.440 - 3.860 uIU/mL 0.092 (L)        Component      Latest Ref Rng & Units 08/25/2019 08/25/2019  10:36 AM 10:36 AM   WBC      4.8 - 10.8 K/uL  4.2 (L)   RBC      4.20 - 5.40 M/uL  4.35   Hemoglobin Quant      12.0 - 16.0 g/dL  13.9   Hematocrit      37.0 - 47.0 %  41.6   MCV      82.0 - 100.0 fL  95.7   MCH      27.0 - 31.3 pg  32.0 (H)   MCHC      33.0 - 37.0 %  33.5   RDW      11.5 - 14.5 %  12.9   Platelet Count      130 - 400 K/uL  199   Neutrophils %      %  49.6   Lymphocyte %      %  38.8   Monocytes %      %  10.0   Eosinophils  %      %  0.6   Basophils %      %  1.0   Neutrophils Absolute      1.4 - 6.5 K/uL  2.1   Lymphocytes Absolute      1.0 - 4.8 K/uL  1.6   Monocytes Absolute      0.2 - 0.8 K/uL  0.4   Eosinophils Absolute      0.0 - 0.7 K/uL  0.0   Basophils Absolute      0.0 - 0.2 K/uL  0.0   Sodium      135 - 144 mEq/L     Potassium      3.4 - 4.9 mEq/L     Chloride      95 - 107 mEq/L     CO2      20 - 31 mEq/L     Anion Gap      9 - 15 mEq/L     Glucose      70 - 99 mg/dL     BUN      8 - 23 mg/dL     Creatinine      0.50 - 0.90 mg/dL     GFR Non-African American      >60     GFR African American      >60     Calcium      8.5 - 9.9 mg/dL     Total Protein      6.3 - 8.0 g/dL     Albumin      3.5 - 4.6 g/dL     Bilirubin      0.2 - 0.7 mg/dL     Alk Phos      40 - 130 U/L     ALT      0 - 33 U/L     AST      0 - 35 U/L     Globulin      2.3 - 3.5 g/dL     Color, UA      Straw/Yellow     Clarity, UA      Clear     Glucose, UA      Negative mg/dL     Bilirubin, Urine      Negative     Ketones, Urine      Negative mg/dL     Specific Gravity, UA      1.005 - 1.030  Blood, Urine      Negative     pH, UA      5.0 - 9.0     Protein, UA      Negative mg/dL     Urobilinogen, Urine      <2.0 E.U./dL     Nitrite, Urine      Negative     Leukocyte Esterase, Urine      Negative     Bacteria, UA      Negative /HPF     Hyaline Casts, UA      0 - 5 /HPF     WBC, UA      0 - 5 /HPF     RBC, UA      0 - 5 /HPF     Epithelial Cells, UA      0 - 5 /HPF     Hemoglobin A1C      4.8 - 5.9 % 6.2 (H)    CRP      0.0 - 5.0 mg/L     Sed Rate      0 - 30 mm     T4 Free      0.84 - 1.68 ng/dL     TSH      0.440 - 3.860 uIU/mL           Review of Systems  Constitutional: Negative for fatigue, fever and sweats.  HEENT: Negative for eye discharge and vision loss. Negative for ear drainage, hearing loss and nasal drainage.  Respiratory: Negative for cough, dyspnea and wheezing.  Cardiovascular:  Negative for chest pain, claudication and irregular  heartbeat/palpitations.      Physical Exam  Vitals:    09/01/19 1428   BP: 112/60   Pulse: 52   Temp: 97.2 ??F (36.2 ??C)   SpO2: 97%   Body mass index is 26.63 kg/m??.  Physical Exam  Constitutional:  Appears well-developed and well-nourished. No distress.   HENT:   Head: Normocephalic and atraumatic.   Right Ear: External ear normal.   Left Ear: External ear normal.   Nose: Nose normal.   Eyes: Conjunctivae and EOM are normal.  Right eye exhibits no discharge. Left eye exhibits no discharge. No scleral icterus.   Neck: Normal range of motion.   Cardiovascular: Normal rate, regular rhythm and normal heart sounds.    No murmur heard.  Pulmonary/Chest: Effort normal and breath sounds normal. No respiratory distress. no wheezes. no rales. no tenderness.   Musculoskeletal: Normal range of motion.   Neurological: alert.   Skin: not diaphoretic.   Psychiatric: normal mood and affect. behavior is normal. Judgment and thought content normal.   Nursing note and vitals reviewed.      Assessment/plan:  Tiffany Wolfe was seen today for discuss labs.    Diagnoses and all orders for this visit:    Weight loss, unintentional  Reviewed all labs  Thyroid meds adjusted  Will still request records from her PCP    Prediabetes  Discussed results    Worn out  -     Covid-19, Antibody; Future  -     Echocardiogram complete; Future  R/o covid  Check echo    Poor appetite  -     Covid-19, Antibody; Future    Pulmonary emphysema, unspecified emphysema type (HCC)  SOB (shortness of breath)  -     Covid-19, Antibody; Future  -     Echocardiogram complete; Future  -     XR CHEST STANDARD (  2 VW); Future      Orders Placed This Encounter   Procedures   ??? XR CHEST STANDARD (2 VW)     Standing Status:   Future     Standing Expiration Date:   08/31/2020   ??? Covid-19, Antibody     Standing Status:   Future     Standing Expiration Date:   08/31/2020     Scheduling Instructions:      CDC does not recommend using antibody testing to diagnose acute infection. It  is recommended to use a direct viral detection test to diagnose acute infection. (COVID-19 EAP [id number])            Antibody tests are not 100% accurate, and some false-positive results or false-negative results may occur. A positive result may not ensure immunity from reinfection.            Per CDC, positive or negative results do not confirm whether a patient is able to spread the virus that causes COVID-19     Order Specific Question:   Symptomatic for COVID-19 as defined by CDC?     Answer:   Yes     Order Specific Question:   Date of Symptom Onset     Answer:   08/13/2019     Order Specific Question:   Hospitalized for COVID-19?     Answer:   No     Order Specific Question:   Admitted to ICU for COVID-19?     Answer:   No     Order Specific Question:   Employed in healthcare setting?     Answer:   No     Order Specific Question:   Resident in a congregate (group) care setting?     Answer:   No     Order Specific Question:   Pregnant?     Answer:   No     Order Specific Question:   Previously tested for COVID-19?     Answer:   No   ??? Echocardiogram complete     Standing Status:   Future     Standing Expiration Date:   10/31/2019     Order Specific Question:   Reason for exam:     Answer:   as above         Return if symptoms worsen or fail to improve.

## 2019-09-07 ENCOUNTER — Inpatient Hospital Stay: Admit: 2019-09-07 | Payer: MEDICARE

## 2019-09-07 ENCOUNTER — Inpatient Hospital Stay: Payer: MEDICARE

## 2019-09-07 DIAGNOSIS — R0602 Shortness of breath: Secondary | ICD-10-CM

## 2019-09-07 DIAGNOSIS — R5383 Other fatigue: Secondary | ICD-10-CM

## 2019-09-08 LAB — COVID-19, ANTIBODY, TOTAL: SARS-CoV-2 Antibody, Total: POSITIVE — AB

## 2019-09-11 ENCOUNTER — Encounter

## 2019-09-11 NOTE — Telephone Encounter (Signed)
CT Lung from previous provider is scanned into media dated 09/14/2018

## 2019-09-13 NOTE — Telephone Encounter (Signed)
Only one I see is from 08/2018 - last year, not the one she had done this year  Thank you!    Willis Modena, MD

## 2019-09-14 ENCOUNTER — Ambulatory Visit: Payer: MEDICARE

## 2019-09-15 ENCOUNTER — Inpatient Hospital Stay
Admit: 2019-09-15 | Discharge: 2019-09-15 | Disposition: A | Discharge status: Home / Self Care | Payer: MEDICARE | Attending: Emergency Medicine

## 2019-09-15 DIAGNOSIS — S61210A Laceration without foreign body of right index finger without damage to nail, initial encounter: Secondary | ICD-10-CM

## 2019-09-15 MED ORDER — TOPICAL SKIN ADHESIVE
Freq: Once | Status: AC
Start: 2019-09-15 — End: 2019-09-15
  Administered 2019-09-15: 20:00:00 1 via TOPICAL

## 2019-09-15 MED FILL — TOPICAL SKIN ADHESIVE: Qty: 1

## 2019-09-15 NOTE — ED Triage Notes (Signed)
Laceration to left index finger. Was cutting fruit.

## 2019-09-15 NOTE — ED Notes (Signed)
Approximately 1 cm laceration to left index finger. Edges well approximated. Pt is soaking it in hibiclens and water.     Josephine Igo, RN  09/15/19 1438

## 2019-09-15 NOTE — ED Provider Notes (Signed)
HPI:  09/15/19, Time: 2:52 PM EST         ANGENETTA PIGG is a 68 y.o. female presenting to the ED for finger laceration, beginning 1 hour ago.  The complaint has been persistent, mild in severity, and worsened by nothing.  Patient cut her right index finger with a knife at home.  There was bleeding.  No swelling.  No loss of tissue.  No paresthesias.    ROS:   Pertinent positives and negatives are stated within HPI, all other systems reviewed and are negative.  --------------------------------------------- PAST HISTORY ---------------------------------------------  Past Medical History:  has a past medical history of Fibromyalgia, Hyperlipidemia, Hypertension, and Hypothyroidism.    Past Surgical History:  has a past surgical history that includes Hysterectomy, vaginal; Cholecystectomy; and Spinal fusion.    Social History:  reports that she quit smoking about 13 years ago. Her smoking use included cigarettes. She has a 60.00 pack-year smoking history. She has never used smokeless tobacco. She reports current alcohol use. She reports that she does not use drugs.    Family History: family history includes Cancer in her brother; Mult Sclerosis in her brother.     The patient's home medications have been reviewed.    Allergies: Seasonal    ---------------------------------------------------PHYSICAL EXAM--------------------------------------     Constitutional/General: Alert and oriented x3, well appearing, non toxic in NAD  Head: Normocephalic and atraumatic  Eyes: PERRL, EOMI  Mouth: Oropharynx clear, handling secretions, no trismus  Neck: Supple, full ROM, non tender to palpation in the midline, no stridor, no crepitus, no meningeal signs  Pulmonary: Lungs clear to auscultation bilaterally, no wheezes, rales, or rhonchi. Not in respiratory distress  Cardiovascular:  Regular rate. Regular rhythm. No murmurs, gallops, or rubs. 2+ distal pulses  Chest: no chest wall tenderness  Abdomen: Soft.  Non tender. Non distended.   +BS.  No rebound, guarding, or rigidity. No pulsatile masses appreciated.  Musculoskeletal: Moves all extremities x 4. Warm and well perfused, no clubbing, cyanosis, or edema. Capillary refill <3 seconds  Skin: warm and dry. No rashes.  There is a superficial 2.5 cm finger laceration of the right index finger  Neurologic: GCS 15, CN 2-12 grossly intact, no focal deficits, symmetric strength 5/5 in the upper and lower extremities bilaterally  Psych: Normal Affect    -------------------------------------------------- RESULTS -------------------------------------------------  I have personally reviewed all laboratory and imaging results for this patient. Results are listed below.     LABS:  No results found for this visit on 09/15/19.    RADIOLOGY:  Interpreted by Radiologist.  No orders to display             ------------------------- NURSING NOTES AND VITALS REVIEWED ---------------------------   The nursing notes within the ED encounter and vital signs as below have been reviewed by myself.  BP (!) 141/73   Pulse 60   Temp 98.2 F (36.8 C) (Oral)   Resp 18   Ht 5\' 6"  (1.676 m)   Wt 160 lb (72.6 kg)   SpO2 96%   BMI 25.82 kg/m   Oxygen Saturation Interpretation: Normal    The patient's available past medical records and past encounters were reviewed.        ------------------------------ ED COURSE/MEDICAL DECISION MAKING----------------------  Medications   topical skin adhesive stick (1 each Topical Given 09/15/19 1454)             Medical Decision Making:      LACERATION REPAIR  PROCEDURE NOTE:  Unless otherwise  indicated, this procedure was done or directly supervised by the ED attending.    Laceration #: 1.  Location: Right index finger  Length: 2.5 cm.  The wound area was cleansend with shur-clens and draped in a sterile fashion.  The wound area was not anesthetized  epinephrine.  WOUND COMPLEXITY:    Debridement: None.  Undermining: None.  Wound Margins Revised: no.  Flaps Aligned: yes.  The wound was  explored with the following results no foreign body or tendon injury seen.  The wound was closed with Dermabond.  Dressing:  a sterile dressing was placed.          Re-Evaluations:             Re-evaluation.  Patient's symptoms are improving      Consultations:                       This patient's ED course included: re-evaluation prior to disposition and a personal history and physicial eaxmination    This patient has remained hemodynamically stable and improved during their ED course.    Counseling:   The emergency provider has spoken with the patient and discussed today's results, in addition to providing specific details for the plan of care and counseling regarding the diagnosis and prognosis.  Questions are answered at this time and they are agreeable with the plan.       --------------------------------- IMPRESSION AND DISPOSITION ---------------------------------    IMPRESSION  1. Laceration of right index finger without foreign body without damage to nail, initial encounter        DISPOSITION  Disposition: Discharge to home  Patient condition is good        NOTE: This report was transcribed using voice recognition software. Every effort was made to ensure accuracy; however, inadvertent computerized transcription errors may be present          Nuala Alpha, MD  09/15/19 1456

## 2019-09-22 ENCOUNTER — Encounter: Attending: Family Medicine

## 2019-09-27 ENCOUNTER — Inpatient Hospital Stay: Payer: MEDICARE

## 2019-09-27 ENCOUNTER — Inpatient Hospital Stay: Admit: 2019-09-27 | Payer: MEDICARE

## 2019-09-27 DIAGNOSIS — R9389 Abnormal findings on diagnostic imaging of other specified body structures: Secondary | ICD-10-CM

## 2019-09-28 ENCOUNTER — Telehealth: Admit: 2019-09-28 | Discharge: 2019-09-28 | Payer: MEDICARE | Attending: Family Medicine

## 2019-09-28 DIAGNOSIS — Z0000 Encounter for general adult medical examination without abnormal findings: Secondary | ICD-10-CM

## 2019-09-28 NOTE — Patient Instructions (Signed)
Personalized Preventive Plan for Tiffany Wolfe - 09/28/2019  Medicare offers a range of preventive health benefits. Some of the tests and screenings are paid in full while other may be subject to a deductible, co-insurance, and/or copay.    Some of these benefits include a comprehensive review of your medical history including lifestyle, illnesses that may run in your family, and various assessments and screenings as appropriate.    After reviewing your medical record and screening and assessments performed today your provider may have ordered immunizations, labs, imaging, and/or referrals for you.  A list of these orders (if applicable) as well as your Preventive Care list are included within your After Visit Summary for your review.    Other Preventive Recommendations:    ?? A preventive eye exam performed by an eye specialist is recommended every 1-2 years to screen for glaucoma; cataracts, macular degeneration, and other eye disorders.  ?? A preventive dental visit is recommended every 6 months.  ?? Try to get at least 150 minutes of exercise per week or 10,000 steps per day on a pedometer .  ?? Order or download the FREE "Exercise & Physical Activity: Your Everyday Guide" from [employer]. Call [phone] or search [employer] online.  ?? You need 1200-1500 mg of calcium and 1000-2000 IU of vitamin D per day. It is possible to meet your calcium requirement with diet alone, but a vitamin D supplement is usually necessary to meet this goal.  ?? When exposed to the sun, use a sunscreen that protects against both UVA and UVB radiation with an SPF of 30 or greater. Reapply every 2 to 3 hours or after sweating, drying off with a towel, or swimming.  ?? Always wear a seat belt when traveling in a car. Always wear a helmet when riding a bicycle or motorcycle.

## 2019-09-28 NOTE — Progress Notes (Signed)
Medicare Annual Wellness Visit  Name: Tiffany Wolfe Today???s Date: 09/28/2019   MRN: [id number] Sex: Female   Age: 68 y.o. Ethnicity: Non-Hispanic/Non Latino   DOB: [birth year] Race: Lenea Bywater is here for Medicare AWV and Results    Screenings for behavioral, psychosocial and functional/safety risks, and cognitive dysfunction are all negative except as indicated below. These results, as well as other patient data from the [place] form, are documented in Flowsheets linked to this Encounter.    Allergies   Allergen Reactions   ??? Seasonal Itching       Prior to Visit Medications    Medication Sig Taking? Authorizing Provider   levothyroxine (SYNTHROID) 175 MCG tablet Take 1 tablet by mouth daily Yes Manuella Ghazi, MD   ibuprofen (ADVIL;MOTRIN) 800 MG tablet  Yes Historical Provider, MD   albuterol sulfate HFA (VENTOLIN HFA) 108 (90 Base) MCG/ACT inhaler Inhale 2 puffs into the lungs every 6 hours as needed for Wheezing Yes Manuella Ghazi, MD   DULoxetine (CYMBALTA) 60 MG extended release capsule Take 60 mg by mouth daily Yes Historical Provider, MD   metoprolol succinate (TOPROL XL) 50 MG extended release tablet Take 50 mg by mouth daily Yes Historical Provider, MD   simvastatin (ZOCOR) 10 MG tablet Take 10 mg by mouth daily Yes Historical Provider, MD       Past Medical History:   Diagnosis Date   ??? Fibromyalgia    ??? Hyperlipidemia    ??? Hypertension    ??? Hypothyroidism        Past Surgical History:   Procedure Laterality Date   ??? CHOLECYSTECTOMY     ??? HYSTERECTOMY, VAGINAL     ??? SPINAL FUSION         Family History   Problem Relation Age of Onset   ??? Mult Sclerosis Brother    ??? Cancer Brother        CareTeam (Including outside providers/suppliers regularly involved in providing care):   Patient Care Team:  Manuella Ghazi, MD as PCP - General (Family Medicine)  Manuella Ghazi, MD as PCP - [facility] Empaneled Provider    Wt Readings from Last 3 Encounters:   09/15/19 160 lb (72.6 kg)   09/01/19 165  lb (74.8 kg)   06/18/19 173 lb 9.6 oz (78.7 kg)     No flowsheet data found.   There is no height or weight on file to calculate BMI.    Based upon direct observation of the patient, evaluation of cognition reveals recent and remote memory intact.      Patient's complete Health Risk Assessment and screening values have been reviewed and are found in Flowsheets. The following problems were reviewed today and where indicated follow up appointments were made and/or referrals ordered.    Positive Risk Factor Screenings with Interventions:     General Health and ACP:  General  In general, how would you say your health is?: Good  In the past 7 days, have you experienced any of the following? New or Increased Pain, New or Increased Fatigue, Loneliness, Social Isolation, Stress or Anger?: None of These  Do you get the social and emotional support that you need?: Yes  Do you have a Living Will?: (!) No  Advance Directives     Power of Attorney Living Will ACP-Advance Directive ACP-Power of Attorney    Not on File Not on File Filed [place] Risk Interventions:  ??  No Living Will: Patient declines ACP discussion/assistance    Health Habits/Nutrition:  Health Habits/Nutrition  Do you exercise for at least 20 minutes 2-3 times per week?: (!) No  Have you lost any weight without trying in the past 3 months?: (!) Yes  Do you eat fewer than 2 meals per day?: (!) Yes  Have you seen a dentist within the past year?: Yes     Health Habits/Nutrition Interventions:  ?? Inadequate physical activity:  educational materials provided to promote increased physical activity  ?? Nutritional issues:  due to recent bout of covid, no more weight loss since then    Hearing/Vision:  No exam data present  Hearing/Vision  Do you or your family notice any trouble with your hearing?: (!) Yes  Do you have difficulty driving, watching TV, or doing any of your daily activities because of your eyesight?: No  Have you had an eye exam within the  past year?: Yes  Hearing/Vision Interventions:  ?? Hearing concerns:  patient declines any further evaluation/treatment for hearing issues    Safety:  Safety  Do you have working smoke detectors?: Yes  Have all throw rugs been removed or fastened?: (!) No  Do you have non-slip mats or surfaces in all bathtubs/showers?: Yes  Do all of your stairways have a railing or banister?: Yes  Are your doorways, halls and stairs free of clutter?: Yes  Do you always fasten your seatbelt when you are in a car?: Yes  Safety Interventions:  ?? Home safety tips provided    Personalized Preventive Plan   Current Health Maintenance Status  Immunization History   Administered Date(s) Administered   ??? Influenza Vaccine, unspecified formulation 08/21/2014   ??? Influenza Virus Vaccine 08/21/2014, 07/23/2015, 07/20/2016, 07/20/2018   ??? Influenza, High Dose (Fluzone 65 yrs and older) 07/20/2013   ??? Influenza, Quadv, IM, PF (6 mo and older Fluzone, Flulaval, Fluarix, and 3 yrs and older Afluria) 08/21/2014   ??? Influenza, Triv, inactivated, subunit, adjuvanted, IM (Fluad 65 yrs and older) 09/01/2018   ??? Pneumococcal Polysaccharide (Pneumovax23) 03/25/2013   ??? Tdap (Boostrix, Adacel) 03/28/2014   ??? Varicella (Varivax) 06/20/2013   ??? Zoster Live (Zostavax) 03/25/2013, 06/20/2013        Health Maintenance   Topic Date Due   ??? DEXA (modify frequency per FRAX score)  12/07/2007   ??? Shingles Vaccine (2 of 3) 08/15/2013   ??? Colon cancer screen colonoscopy  05/20/2016   ??? Pneumococcal 65+ years Vaccine (1 of 1 - PPSV23) 03/25/2018   ??? Annual Wellness Visit (AWV)  08/24/2018   ??? Low dose CT lung screening  12/09/2018   ??? Flu vaccine (1) 06/21/2019   ??? Breast cancer screen  12/10/2019   ??? Lipid screen  01/06/2020   ??? A1C test (Diabetic or Prediabetic)  08/24/2020   ??? TSH testing  08/24/2020   ??? DTaP/Tdap/Td vaccine (2 - Td) 03/28/2024   ??? Hepatitis C screen  Completed   ??? Hepatitis A vaccine  Aged Out   ??? Hepatitis B vaccine  Aged Out   ??? Hib vaccine   Aged Out   ??? Meningococcal (ACWY) vaccine  Aged Out     Recommendations for Preventive Services Due: see orders and patient instructions/AVS.  .  Recommended screening schedule for the next 5-10 years is provided to the patient in written form: see Patient Instructions/AVS.    Tiffany Wolfe was seen today for medicare awv and results.    Diagnoses and all orders for  this visit:    Routine general medical examination at a health care facility    Pleuritic chest pain  -     EKG 12 lead; Future  SOB (shortness of breath)  -     EKG 12 lead; Future  High priority for COVID-19 virus vaccination  -     EKG 12 lead; Future  Continues to have lower rish rib cage area pain - worse with deep breath  Check EXG along with echo  CXR from yesterday was clear  Reviewed CT chest from 11/2018    Hypothyroidism, unspecified type  -     TSH with Reflex; Future  -     T4, Free; Future  Stable, controlled  Plan: continue current medications    Elevated erythrocyte sedimentation rate  -     Sedimentation Rate; Future  CRP elevated  -     C-Reactive Protein; Future  Recheck labs      Tiffany Wolfe is a 68 y.o. female being evaluated by a Virtual Visit (video and audio) encounter to address concerns as mentioned above.  A caregiver was present when appropriate. Due to this being a Scientist, research (medical)TeleHealth encounter (During COVID-19 public health emergency), evaluation of the following organ systems was limited: Vitals/Constitutional/EENT/Resp/CV/GI/GU/MS/Neuro/Skin/Heme-Lymph-Imm.  Pursuant to the emergency declaration under the [facility] Act and the [employer] Emergencies Act, 1135 waiver authority and the [employer]onavirus Preparedness and [employer] Supplemental Appropriations Act, this Virtual Visit was conducted with patient's (and/or legal guardian's) consent, to reduce the patient's risk of exposure to COVID-19 and provide necessary medical care.  The patient (and/or legal guardian) has also been advised to contact this office for worsening conditions or problems,  and seek emergency medical treatment and/or call 911 if deemed necessary.     Patient identification was verified at the start of the visit: Yes    Services were provided through a video synchronous discussion virtually to substitute for in-person clinic visit. Patient and provider were located at their individual homes.    --Willis ModenaSaadia Ashar Lewinski, MD on 09/28/2019 at 11:28 AM  Return in about 3 months (around 12/27/2019) for f/u abnl labs, SOB exertion, post covid fatigue .    An electronic signature was used to authenticate this note.

## 2019-10-04 ENCOUNTER — Ambulatory Visit: Payer: MEDICARE

## 2019-10-05 ENCOUNTER — Ambulatory Visit: Payer: MEDICARE

## 2019-10-11 ENCOUNTER — Inpatient Hospital Stay: Admit: 2019-10-11 | Payer: MEDICARE

## 2019-10-11 DIAGNOSIS — R0602 Shortness of breath: Secondary | ICD-10-CM

## 2019-10-11 LAB — ECHOCARDIOGRAM COMPLETE 2D W DOPPLER W COLOR: Left Ventricular Ejection Fraction: 60

## 2019-10-11 LAB — EKG 12-LEAD
Atrial Rate: 61 {beats}/min
P Axis: 45 degrees
P-R Interval: 208 ms
Q-T Interval: 430 ms
QRS Duration: 90 ms
QTc Calculation (Bazett): 432 ms
R Axis: 36 degrees
T Axis: 44 degrees
Ventricular Rate: 61 {beats}/min

## 2019-12-29 IMAGING — MR MRI THORACIC SPINE WITHOUT CONTRAST
4 of 7 series · 16 of 48 positions shown · IV contrast (gadolinium)
Comparison: Radiographs of 01/04/2016.

MRI THORACIC SPINE WITHOUT CONTRAST, 12/29/2019 [DATE]: 
CLINICAL INDICATION: Prespinal stimulator scan.
TECHNIQUE: Multiplanar, multiecho position MR images of the thoracic spine were 
performed without intravenous gadolinium enhancement.

[Series 201: t2w_cor-surv · coronal · 10.0mm · 0.88mm/px · 6 of 20 slices shown]
[im 1/20]
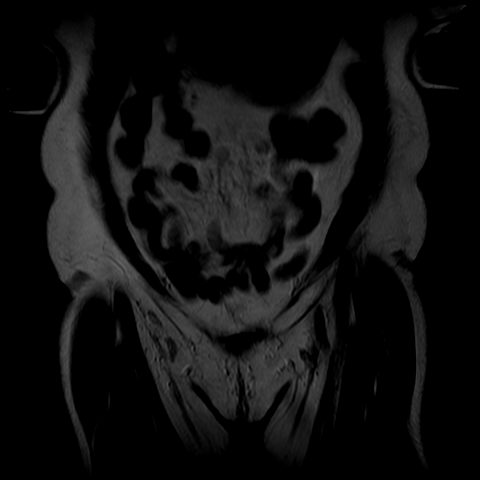
[im 4/20]
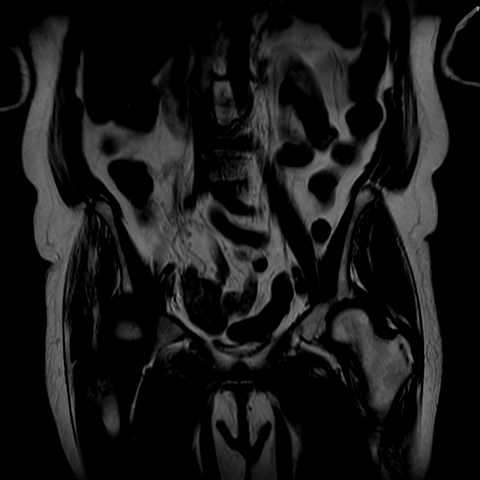
[im 8/20]
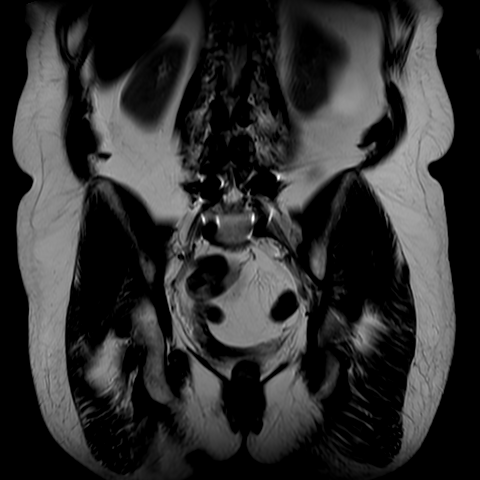
[im 12/20]
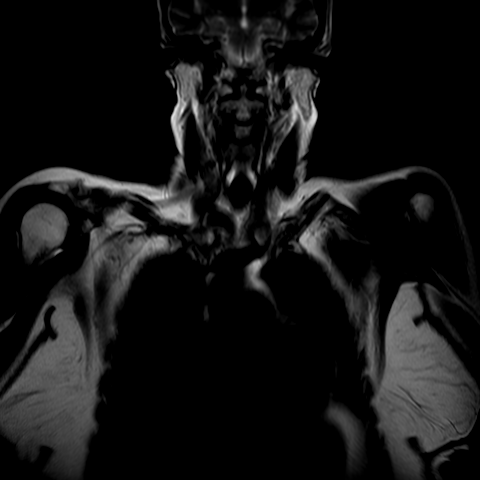
[im 16/20]
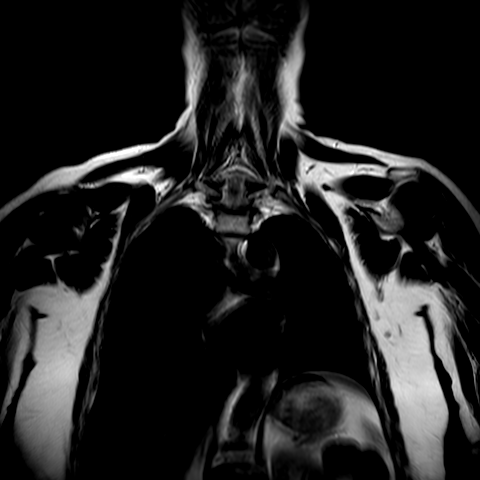
[im 20/20]
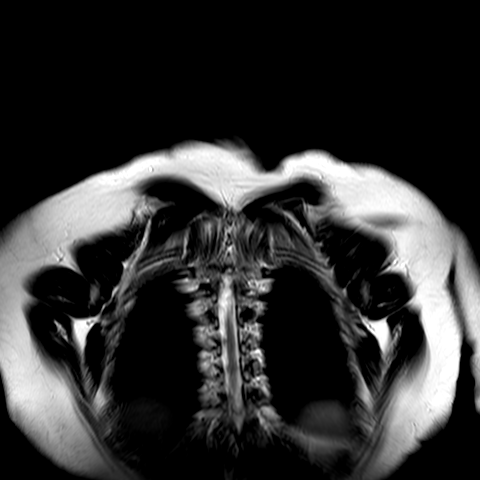

[Series 303: T1 · sagittal · 5.5mm · 0.66mm/px · 4 of 15 slices shown]
[im 1/15]
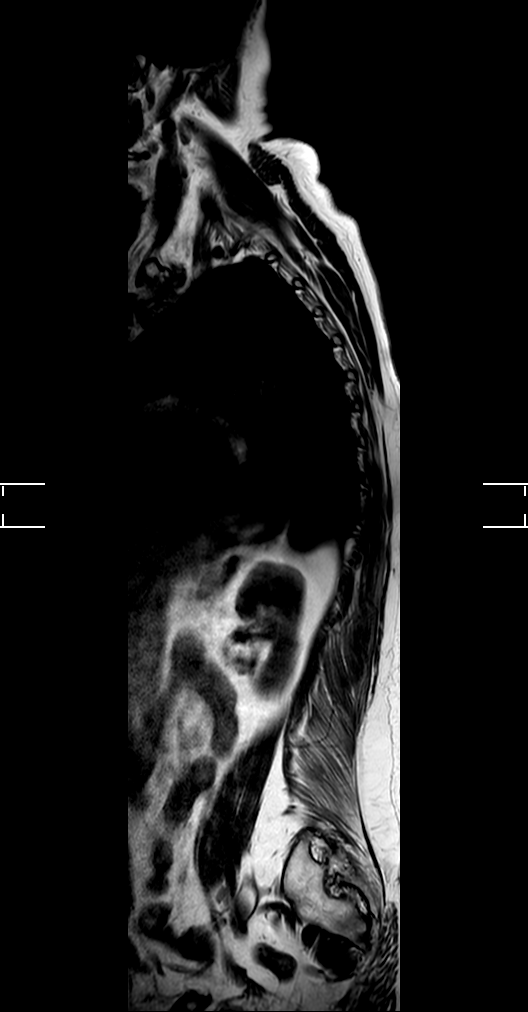
[im 5/15]
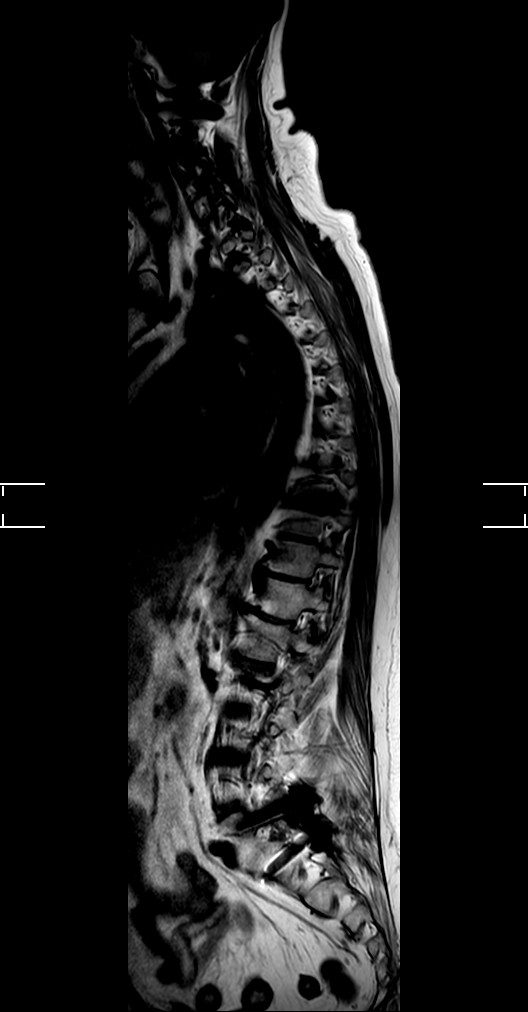
[im 10/15]
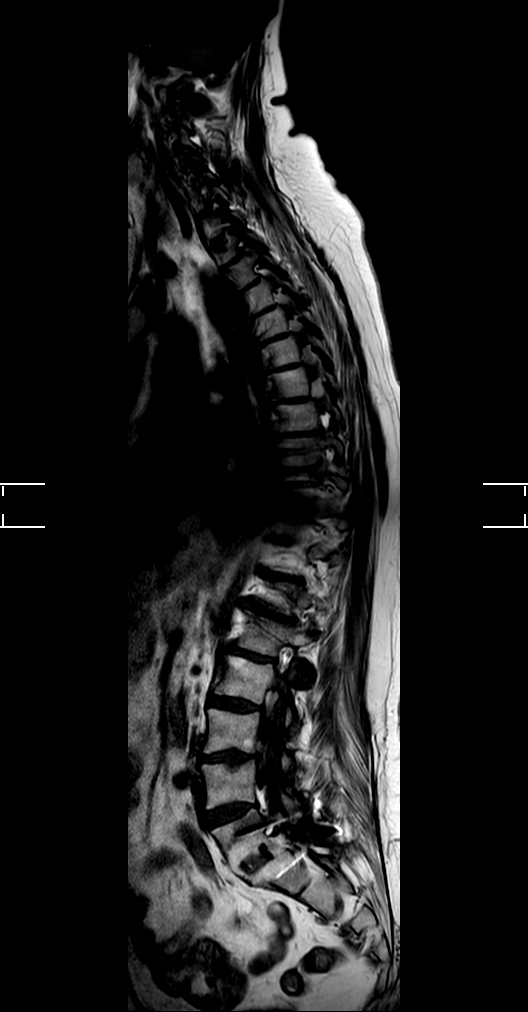
[im 15/15]
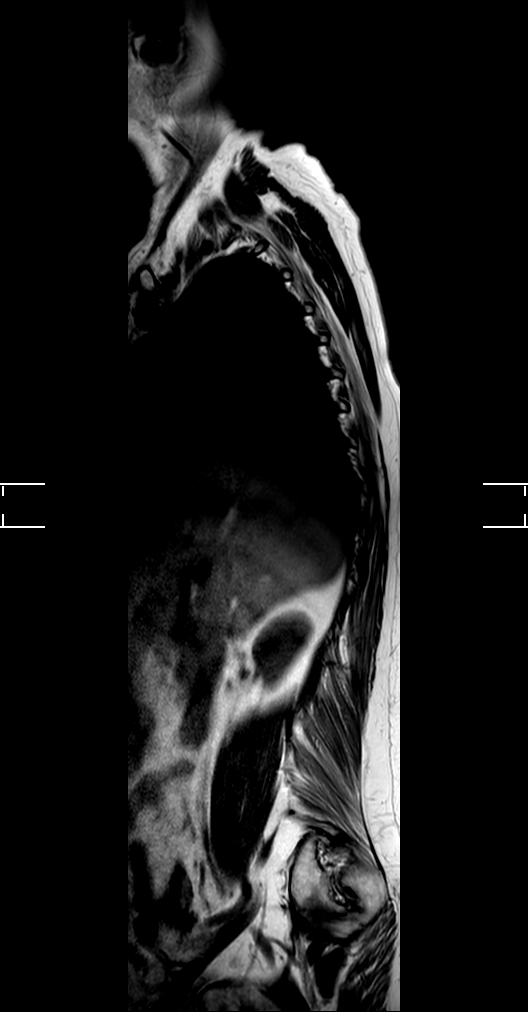

[Series 401: t1_tse_sag · sagittal · 3.0mm · 0.62mm/px · 3 of 21 slices shown]
[im 5/21]
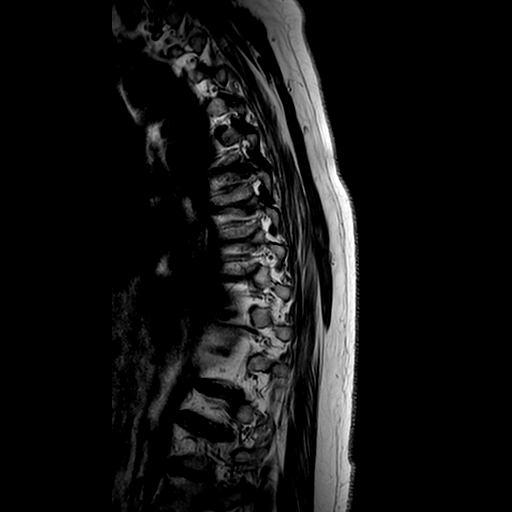
[im 13/21]
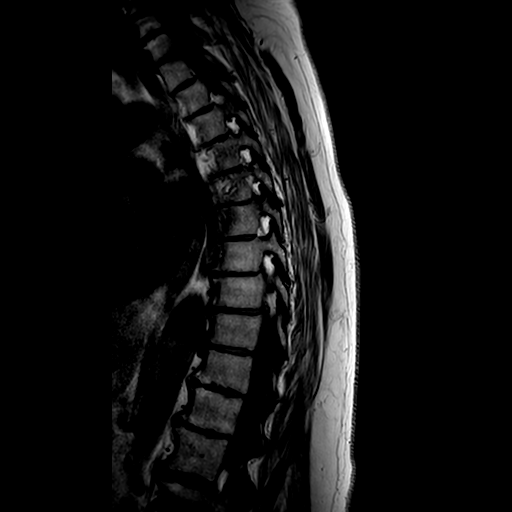
[im 21/21]
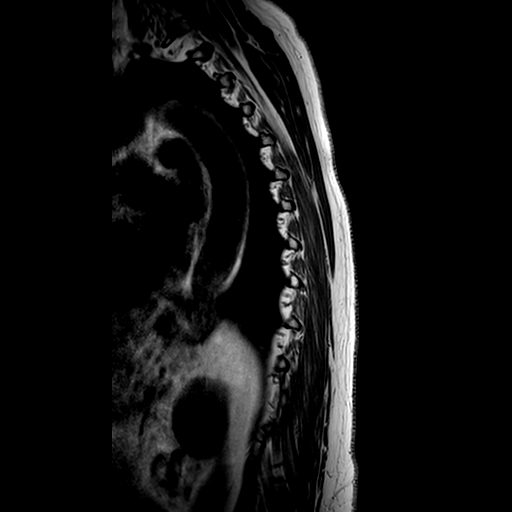

[Series 501: t2_tse_sag · sagittal · 3.0mm · 0.62mm/px · 3 of 21 slices shown]
[im 5/21]
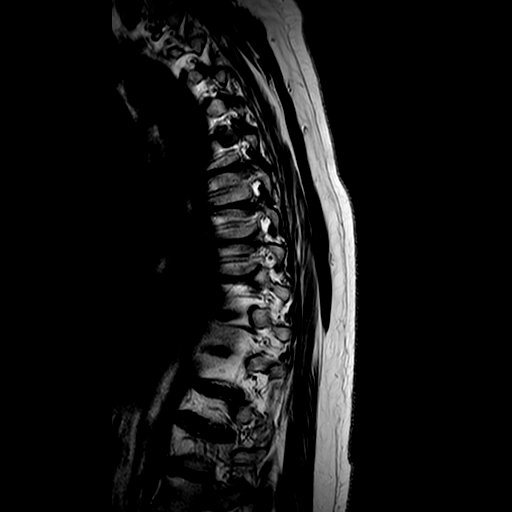
[im 13/21]
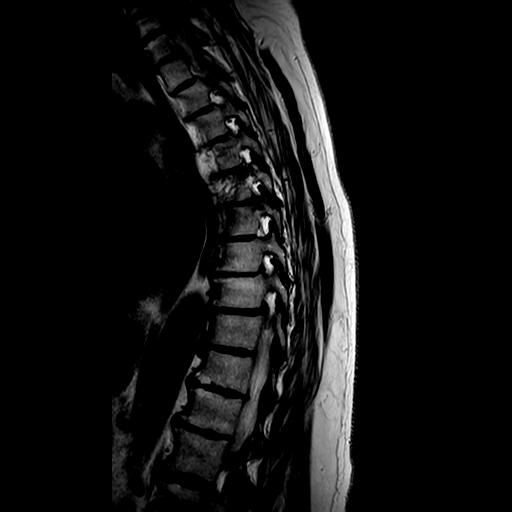
[im 21/21]
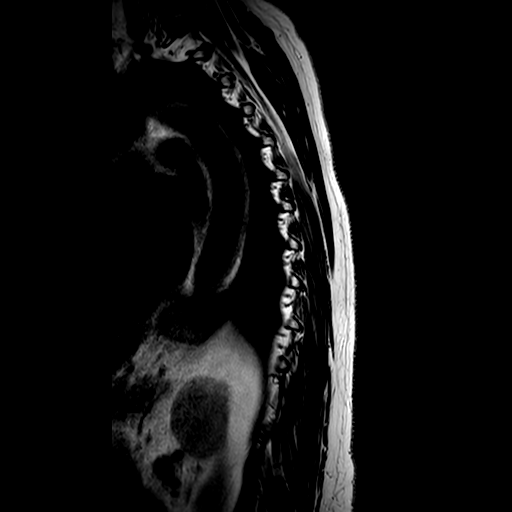

[16 of 48 positions shown; findings below may reference images not displayed]

FINDINGS: The vertebral bodies are normal in height. No acute or chronic 
vertebral body fracture. Trace anterolisthesis T1-T2 and T2 and T3. Scattered 
disc desiccation and disc height loss. There is mild broad-based disc protrusion 
at the C1-C2 and T2-T3 with partial effacement of the ventral thecal sac and 
mild bilateral neural foraminal stenoses. Mild broad-based disc protrusion at T8 
with partial effacement of ventral thecal sac and T10-T11 and T11-T12. No spinal 
canal stenosis. The cord is normal in course and caliber without signal 
abnormality or mass. The conus tip terminates at the L1 vertebral body level. 7 
cm left renal cyst is partially included. Paraspinal musculature is symmetric. 
Included on the diffusion T1 series, there is transverse screw and rod fixation 
with interbody spacer at L5-S1. There are discogenic marrow signal endplate 
changes on the left at L3-L4.
IMPRESSION: Moderate spondylotic changes thoracic spine.

## 2020-02-17 ENCOUNTER — Encounter: Attending: Family Medicine

## 2020-02-17 ENCOUNTER — Encounter: Payer: MEDICARE | Attending: Family Medicine

## 2020-02-23 ENCOUNTER — Inpatient Hospital Stay: Admit: 2020-02-23 | Payer: MEDICARE

## 2020-02-23 ENCOUNTER — Ambulatory Visit: Admit: 2020-02-23 | Discharge: 2020-02-23 | Payer: MEDICARE | Attending: Family Medicine

## 2020-02-23 ENCOUNTER — Inpatient Hospital Stay: Payer: MEDICARE

## 2020-02-23 DIAGNOSIS — S4991XA Unspecified injury of right shoulder and upper arm, initial encounter: Secondary | ICD-10-CM

## 2020-02-23 DIAGNOSIS — M7501 Adhesive capsulitis of right shoulder: Secondary | ICD-10-CM

## 2020-02-23 MED ORDER — METHYLPREDNISOLONE 4 MG PO TBPK
4 MG | PACK | ORAL | 0 refills | Status: AC
Start: 2020-02-23 — End: 2020-02-29

## 2020-03-02 ENCOUNTER — Telehealth

## 2020-03-02 NOTE — Telephone Encounter (Signed)
Referral placed

## 2020-03-07 ENCOUNTER — Telehealth: Admit: 2020-03-07 | Discharge: 2020-03-07 | Payer: MEDICARE | Attending: Family Medicine

## 2020-03-07 DIAGNOSIS — M7501 Adhesive capsulitis of right shoulder: Secondary | ICD-10-CM

## 2020-03-07 MED ORDER — SIMVASTATIN 10 MG PO TABS
10 MG | ORAL_TABLET | Freq: Every day | ORAL | 1 refills | Status: AC
Start: 2020-03-07 — End: ?

## 2020-03-07 MED ORDER — METOPROLOL SUCCINATE ER 50 MG PO TB24
50 MG | ORAL_TABLET | Freq: Every day | ORAL | 1 refills | Status: AC
Start: 2020-03-07 — End: ?

## 2020-03-08 ENCOUNTER — Ambulatory Visit: Admit: 2020-03-08 | Discharge: 2020-03-08 | Payer: MEDICARE | Attending: Family Medicine

## 2020-03-08 DIAGNOSIS — M7501 Adhesive capsulitis of right shoulder: Secondary | ICD-10-CM

## 2020-03-21 MED ORDER — TRIAMCINOLONE ACETONIDE 40 MG/ML IJ SUSP
40 MG/ML | Freq: Once | INTRAMUSCULAR | Status: AC
Start: 2020-03-21 — End: 2020-03-08
  Administered 2020-03-08: 15:00:00 40 mg via INTRAMUSCULAR

## 2020-04-10 ENCOUNTER — Encounter: Payer: MEDICARE | Attending: Family Medicine

## 2020-04-24 ENCOUNTER — Ambulatory Visit: Admit: 2020-04-24 | Discharge: 2020-04-24 | Payer: MEDICARE | Attending: Family Medicine

## 2020-04-24 ENCOUNTER — Encounter

## 2020-04-24 DIAGNOSIS — E039 Hypothyroidism, unspecified: Secondary | ICD-10-CM

## 2020-04-24 MED ORDER — LEVOTHYROXINE SODIUM 25 MCG PO TABS
25 MCG | ORAL_TABLET | Freq: Every day | ORAL | 0 refills | Status: AC
Start: 2020-04-24 — End: 2022-09-23

## 2020-04-25 LAB — COMPREHENSIVE METABOLIC PANEL
ALT: 38 U/L — ABNORMAL HIGH (ref 0–33)
AST: 32 U/L (ref 0–35)
Albumin: 4.3 g/dL (ref 3.5–4.6)
Alkaline Phosphatase: 59 U/L (ref 40–130)
Anion Gap: 7 mEq/L — ABNORMAL LOW (ref 9–15)
BUN: 22 mg/dL (ref 8–23)
CO2: 29 mEq/L (ref 20–31)
Calcium: 10.2 mg/dL — ABNORMAL HIGH (ref 8.5–9.9)
Chloride: 99 mEq/L (ref 95–107)
Creatinine: 0.7 mg/dL (ref 0.50–0.90)
GFR African American: >60 (ref >60)
GFR Non-African American: >60 (ref >60)
Globulin: 2.6 g/dL (ref 2.3–3.5)
Glucose: 88 mg/dL (ref 70–99)
Potassium: 5.2 mEq/L — ABNORMAL HIGH (ref 3.4–4.9)
Sodium: 135 mEq/L (ref 135–144)
Total Bilirubin: 0.5 mg/dL (ref 0.2–0.7)
Total Protein: 6.9 g/dL (ref 6.3–8.0)

## 2020-04-25 LAB — CBC WITH AUTO DIFFERENTIAL
Basophils %: 1 %
Basophils Absolute: 0.1 10*3/uL (ref 0.0–0.2)
Eosinophils %: 2.4 %
Eosinophils Absolute: 0.2 10*3/uL (ref 0.0–0.7)
Hematocrit: 40.4 % (ref 37.0–47.0)
Hemoglobin: 13.9 g/dL (ref 12.0–16.0)
Lymphocytes %: 43.3 %
Lymphocytes Absolute: 2.9 10*3/uL (ref 1.0–4.8)
MCH: 33.1 pg — ABNORMAL HIGH (ref 27.0–31.3)
MCHC: 34.4 % (ref 33.0–37.0)
MCV: 96.2 fL (ref 82.0–100.0)
Monocytes %: 7.2 %
Monocytes Absolute: 0.5 10*3/uL (ref 0.2–0.8)
Neutrophils %: 46.1 %
Neutrophils Absolute: 3.1 10*3/uL (ref 1.4–6.5)
Platelets: 197 10*3/uL (ref 130–400)
RBC: 4.2 M/uL (ref 4.20–5.40)
RDW: 12.3 % (ref 11.5–14.5)
WBC: 6.8 10*3/uL (ref 4.8–10.8)

## 2020-04-25 LAB — C-REACTIVE PROTEIN: CRP: 0.9 mg/L (ref 0.0–5.0)

## 2020-04-25 LAB — TSH WITH REFLEX: TSH: 0.02 u[IU]/mL — ABNORMAL LOW (ref 0.440–3.860)

## 2020-04-25 LAB — SEDIMENTATION RATE: Sed Rate: 12 mm (ref 0–30)

## 2020-04-25 LAB — T4, FREE: T4 Free: 2.12 ng/dL — ABNORMAL HIGH (ref 0.84–1.68)

## 2020-04-26 LAB — ANA SCREEN WITH REFLEX: ANA Ab, IgG ELISA: NOT DETECTED

## 2020-04-27 ENCOUNTER — Telehealth

## 2020-04-27 NOTE — Telephone Encounter (Signed)
New referral placed

## 2020-04-27 NOTE — Telephone Encounter (Signed)
Dr. Charolette Child office called and he is not taking new Fibro referrals.  Patient will need a new referral.    Thank you

## 2020-04-30 NOTE — Telephone Encounter (Signed)
Requesting medication refill. Please approve or deny this request.    Rx requested:  Requested Prescriptions     Pending Prescriptions Disp Refills   ??? levothyroxine (SYNTHROID) 200 MCG tablet 30 tablet 2     Sig: TAKE 1 TABLET BY MOUTH ONCE DAILY       Last Office Visit, reason seen and by who:   04/24/2020   Hypothyroidism, Tiffany Wolfe     FOLLOW UP PLAN FROM LAST VISIT: COPY AND PASTE FROM LAST NOTE    No follow ups on file       PATIENT CONTACTED FOR A FOLLOW UP APPT:   YES OR NO    No , pt called in     Next Visit Date:  Future Appointments   Date Time Provider Department Center   05/15/2020  1:00 PM [facility] [address] [place] [address] [facility]   05/15/2020  1:30 PM [facility] [place]   05/15/2020  2:30 PM [facility] [place] [facility] [place] [facility]   08/20/2020 10:00 AM Willis Modena, MD [facility]

## 2020-04-30 NOTE — Telephone Encounter (Signed)
Formatting of this note is different from the original.  Requesting medication refill. Please approve or deny this request.    Rx requested:  Requested Prescriptions     Pending Prescriptions Disp Refills   ? levothyroxine (SYNTHROID) 200 MCG tablet 30 tablet 2     Sig: TAKE 1 TABLET BY MOUTH ONCE DAILY     Last Office Visit, reason seen and by who:   04/24/2020   Hypothyroidism, hussain     FOLLOW UP PLAN FROM LAST VISIT: COPY AND PASTE FROM LAST NOTE    No follow ups on file     PATIENT CONTACTED FOR A FOLLOW UP APPT:   YES OR NO    No , pt called in     Next Visit Date:  Future Appointments   Date Time Provider Department Center   05/15/2020  1:00 PM [facility] [address] [place] [place]   05/15/2020  1:30 PM [facility] [place] [facility] [place]   05/15/2020  2:30 PM [facility] [address] [facility]   08/20/2020 10:00 AM Willis Modena, MD [facility]       Electronically signed by Leory Plowman, Incoming Notes-[facility] Source Conversion at 07/19/2021 10:16 AM EDT

## 2020-05-02 MED ORDER — LEVOTHYROXINE SODIUM 200 MCG PO TABS
200 MCG | ORAL_TABLET | ORAL | 2 refills | Status: AC
Start: 2020-05-02 — End: 2022-09-23

## 2020-05-15 ENCOUNTER — Inpatient Hospital Stay: Admit: 2020-05-15 | Payer: MEDICARE

## 2020-05-15 DIAGNOSIS — Z87891 Personal history of nicotine dependence: Secondary | ICD-10-CM

## 2020-05-15 DIAGNOSIS — Z122 Encounter for screening for malignant neoplasm of respiratory organs: Secondary | ICD-10-CM

## 2020-05-15 DIAGNOSIS — G8929 Other chronic pain: Secondary | ICD-10-CM

## 2020-05-18 NOTE — Telephone Encounter (Signed)
Please see results notes    Thank you!    Willis Modena, MD

## 2020-05-18 NOTE — Telephone Encounter (Signed)
-----   Message from Caffie Pinto sent at 05/17/2020  5:14 PM EDT -----  Subject: Message to Provider    QUESTIONS  Information for Provider? Patient is calling for the results of her MRI   and would like someone to contact her tomorrow if they have been received.     ---------------------------------------------------------------------------  --------------  CALL BACK INFO  What is the best way for the office to contact you? OK to leave message on   voicemail  Preferred Call Back Phone Number? [phone]  ---------------------------------------------------------------------------  --------------  SCRIPT ANSWERS  Relationship to Patient? Self

## 2020-05-18 NOTE — Telephone Encounter (Signed)
Please review MRI

## 2020-05-25 NOTE — Telephone Encounter (Signed)
Lmom that Dexa was normal.

## 2020-05-29 NOTE — Telephone Encounter (Signed)
-----   Message from Boyd sent at 05/28/2020  1:42 PM EDT -----  Subject: Message to Provider    QUESTIONS  Information for Provider? Pt states PCP was referring her to   rheumatologist and ortho, but she has not been contacted.   ---------------------------------------------------------------------------  --------------  CALL BACK INFO  What is the best way for the office to contact you? OK to leave message on   voicemail  Preferred Call Back Phone Number? [phone]  ---------------------------------------------------------------------------  --------------  SCRIPT ANSWERS  Relationship to Patient? Self

## 2020-05-29 NOTE — Telephone Encounter (Signed)
Please give pt contact information from referrals scanned in media. Ortho and Rheu

## 2020-05-30 NOTE — Telephone Encounter (Signed)
Pt called back, given phone numbers for Dr. Karie Georges and Dr. Kyra Manges. MRI results faxed to [facility]

## 2020-08-20 ENCOUNTER — Encounter: Attending: Family Medicine

## 2020-11-26 IMAGING — MR MRI BRAIN W/WO CONTRAST
9 of 11 series · 34 of 48 positions shown · IV contrast (gadavist)
Comparison: None

MRI BRAIN W/WO CONTRAST,11/26/2020 [DATE]: 
CLINICAL INDICATION: Headache x3 months, blurred vision
TECHNIQUE: Axial T1, Axial T2, Axial FLAIR, Diffusion weighted images, Sagittal 
T1, Enhanced Axial T1, and Enhanced coronal fat-suppressed T1 were obtained. 7 
cc's of Gadavist was injected intravenously by hand. GFR greater than 60

[Series 103: patient aligned mpr · axial · 15.6mm · 0.98mm/px · z∈[-96,+131]mm · 6 of 50 slices shown]
[im 1/50]
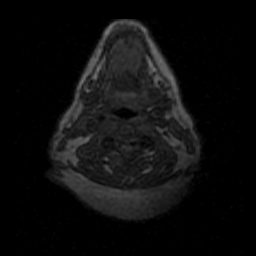
[im 10/50]
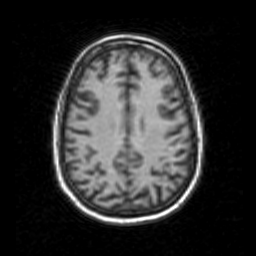
[im 20/50]
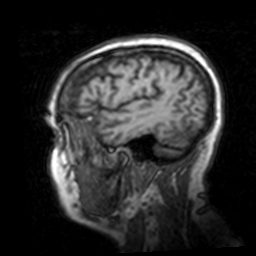
[im 30/50]
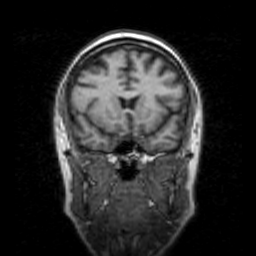
[im 40/50]
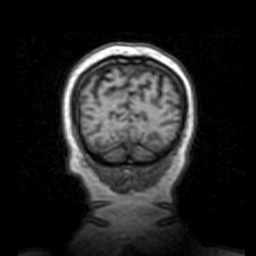
[im 50/50]
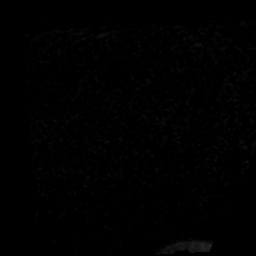

[Series 201: T1 · sagittal · 4.0mm · 0.86mm/px · 3 of 25 slices shown (1 of 2)]
[im 1/25]
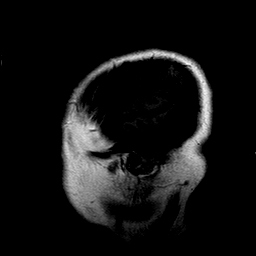
[im 13/25]
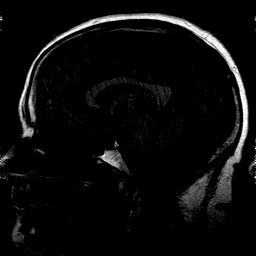
[im 25/25]
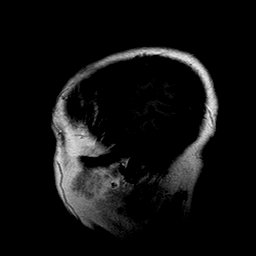

[Series 301: T1 · axial · 5.0mm · 0.60mm/px · z∈[-61,+95]mm · 3 of 27 slices shown (2 of 2)]
[im 1/27]
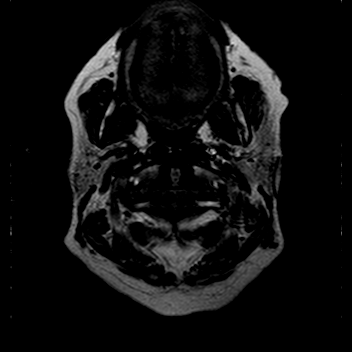
[im 14/27]
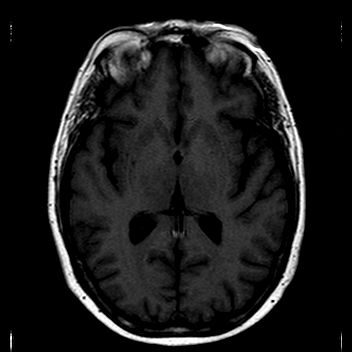
[im 27/27]
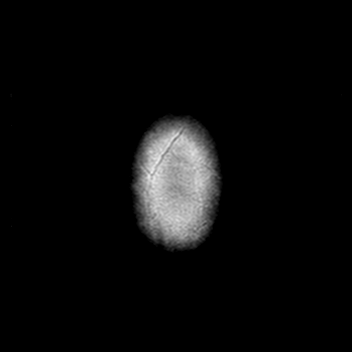

[Series 401: T2 · axial · 5.0mm · 0.41mm/px · z∈[-61,+95]mm · 3 of 27 slices shown]
[im 1/27]
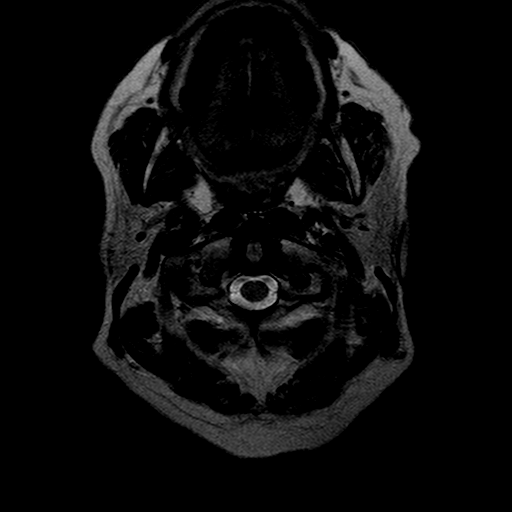
[im 14/27]
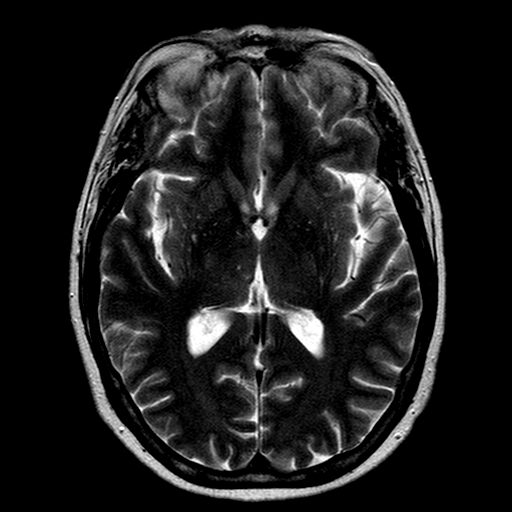
[im 27/27]
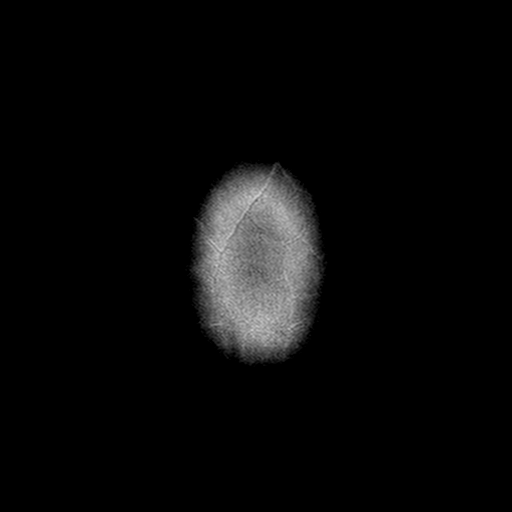

[Series 501: FLAIR · axial · 5.0mm · 0.73mm/px · z∈[-61,+95]mm · 3 of 27 slices shown]
[im 1/27]
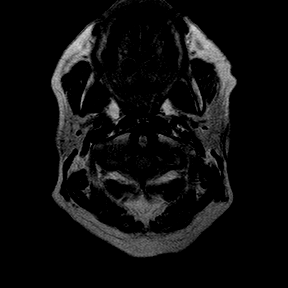
[im 14/27]
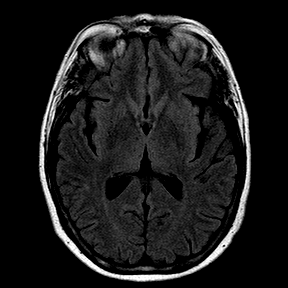
[im 27/27]
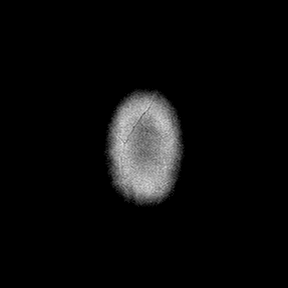

[Series 603: (id) · axial · 5.0mm · 1.80mm/px · z∈[-61,+95]mm · 3 of 27 slices shown]
[im 1/27]
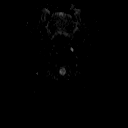
[im 14/27]
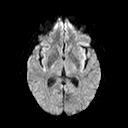
[im 27/27]
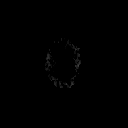

[Series 604: dadc map · axial · 5.0mm · 1.80mm/px · z∈[-61,+95]mm · 3 of 27 slices shown]
[im 1/27]
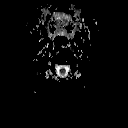
[im 14/27]
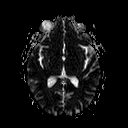
[im 27/27]
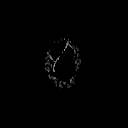

[Series 701: (person_name)_(person_name) · axial · 5.0mm · 0.41mm/px · z∈[-57,+91]mm · 7 of 60 slices shown]
[im 1/60]
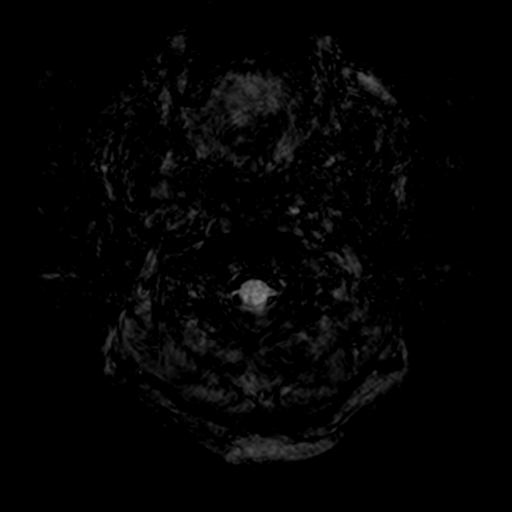
[im 10/60]
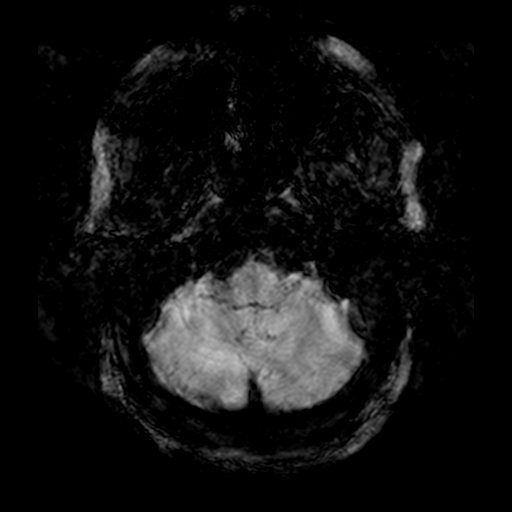
[im 20/60]
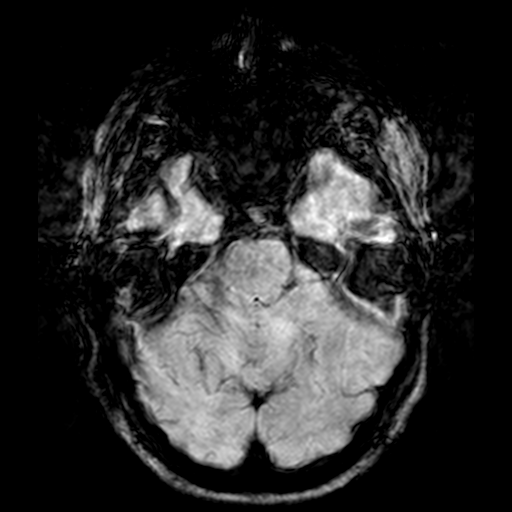
[im 30/60]
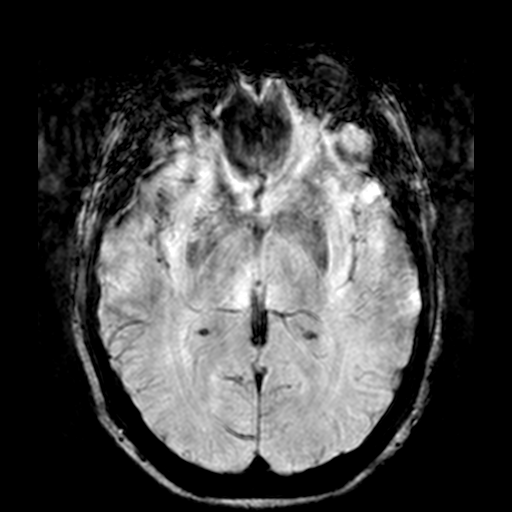
[im 40/60]
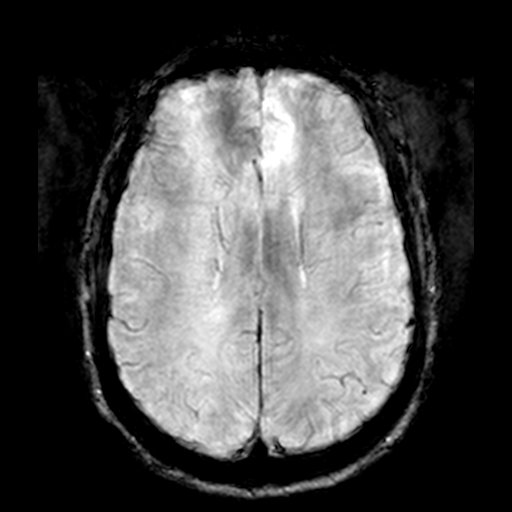
[im 50/60]
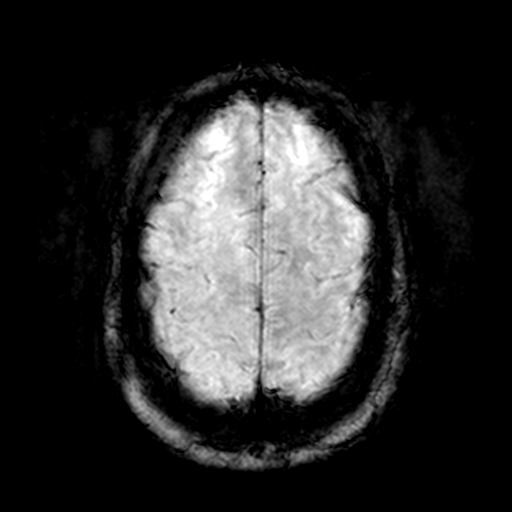
[im 60/60]
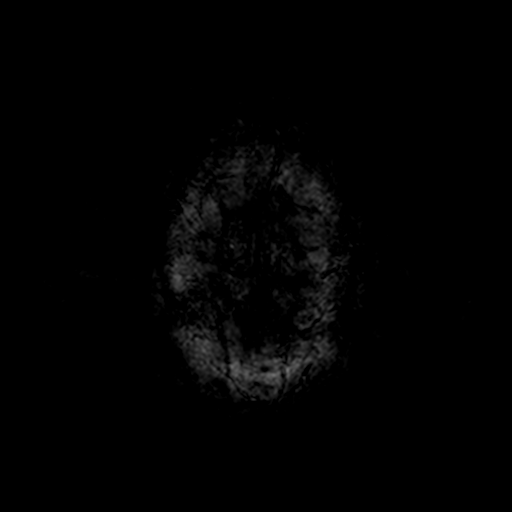

[Series 702: susceptibility · axial · 10.0mm · 0.41mm/px · z∈[-40,+3]mm · 3 of 84 slices shown]
[im 1/84]
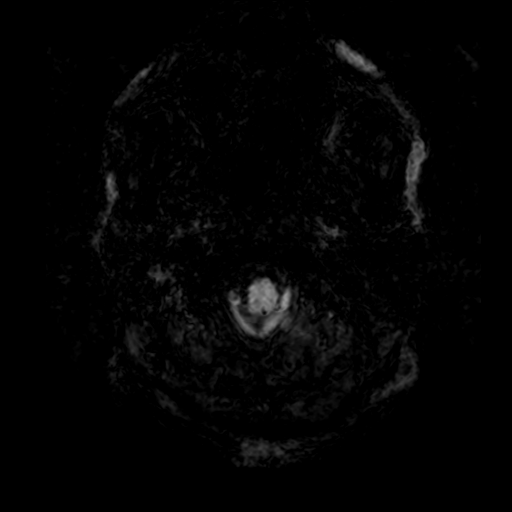
[im 10/84]
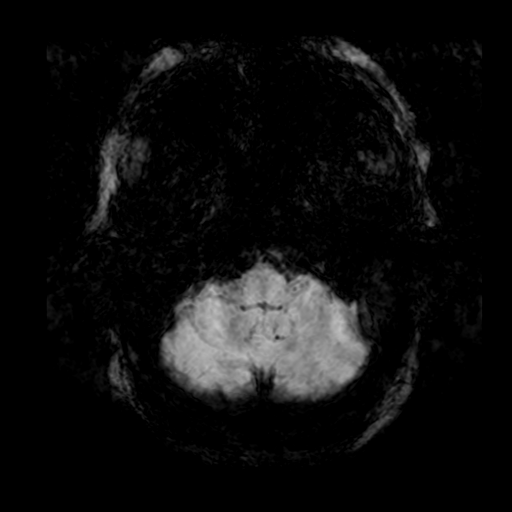
[im 28/84]
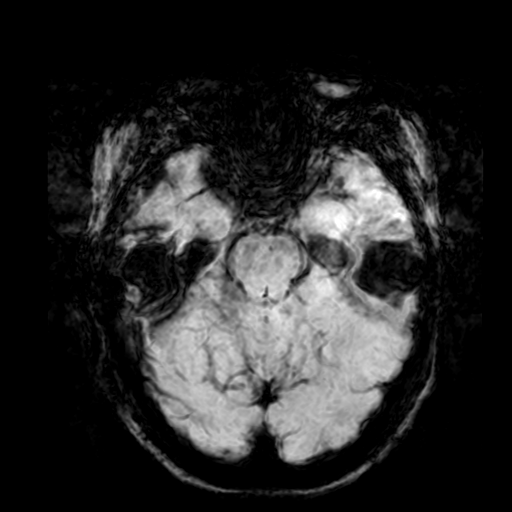

[34 of 48 positions shown; findings below may reference images not displayed]

FINDINGS: There is no intracranial mass or pathologic enhancement. 
Diffusion-weighted images are negative. There is no hydrocephalus. No atrophy. 
No significant cerebral white matter signal abnormality. Mild increased signal 
on the pontine tegmen. Central ganglia and cerebellum are intact. There is no 
parenchymal hemosiderin deposition. 
Major arterial segments are open. No orbital mass. There has been bilateral 
ocular lens implantation. Paranasal sinuses and mastoid spaces are clear. Left 
maxillary antrum is hypoplastic. The craniocervical junction is open. There is 
mild atlantoaxial degenerative change, with mild spurring along the posterior 
dens encroaching on the ventral cord opposite C1. There is mild canal stenosis 
at this level. There is borderline cerebellar tonsillar ectopia. There is an 12 
by 11 mm right frontal scalp sebaceous cyst.
IMPRESSION: No intracranial mass, ischemic or demyelinating changes or hydrocephalus. 
Major intracranial arterial segments are open. No evidence for aneurysm. Dural 
sinuses are open. 
Atlantoaxial degenerative changes. Mild canal stenosis opposite C1. There is 
borderline cerebellar tonsillar ectopia. Potentially these findings worsen with 
cranial flexion; correlation with patient's pain distribution recommended. 
Cervical MRI would be useful for further evaluation. 
Incidental note made of an 12 by 11 mm right frontal scalp sebaceous cyst. 
Calvarium is intact.

## 2021-03-23 ENCOUNTER — Inpatient Hospital Stay
Admit: 2021-03-23 | Discharge: 2021-03-23 | Disposition: A | Discharge status: Home / Self Care | Payer: MEDICARE | Attending: Emergency Medicine

## 2021-03-23 DIAGNOSIS — S61052A Open bite of left thumb without damage to nail, initial encounter: Secondary | ICD-10-CM

## 2021-03-23 MED ORDER — OXYCODONE-ACETAMINOPHEN 5-325 MG PO TABS
5-325 MG | Freq: Once | ORAL | Status: AC
Start: 2021-03-23 — End: 2021-03-23
  Administered 2021-03-23: 20:00:00 1 via ORAL

## 2021-03-23 MED ORDER — AMOXICILLIN-POT CLAVULANATE 875-125 MG PO TABS
875-125 MG | Freq: Once | ORAL | Status: AC
Start: 2021-03-23 — End: 2021-03-23
  Administered 2021-03-23: 20:00:00 1 via ORAL

## 2021-03-23 MED ORDER — AMOXICILLIN-POT CLAVULANATE 875-125 MG PO TABS
875-125 MG | ORAL_TABLET | Freq: Two times a day (BID) | ORAL | 0 refills | Status: AC
Start: 2021-03-23 — End: 2021-04-02

## 2021-03-23 MED ORDER — HYDROCODONE-ACETAMINOPHEN 5-325 MG PO TABS
5-325 MG | ORAL_TABLET | Freq: Four times a day (QID) | ORAL | 0 refills | Status: AC | PRN
Start: 2021-03-23 — End: 2021-03-26

## 2021-03-23 MED FILL — OXYCODONE-ACETAMINOPHEN 5-325 MG PO TABS: 5-325 mg | ORAL | Qty: 1

## 2021-03-23 MED FILL — AMOXICILLIN-POT CLAVULANATE 875-125 MG PO TABS: 875-125 mg | ORAL | Qty: 1

## 2021-03-23 NOTE — ED Notes (Signed)
Explained discharge instructions and two prescriptions to patient.  Went over discharge diagnosis and pertinent educational material with patient.  Patient stated understanding of discharge diagnosis, instructions, and prescriptions.  Patient denies any questions at this time.  No signs or symptoms of pain or distress noted at this time.  Patient discharged to home with family member. A/0 x3, ambulatory, resps even and unlabored on room air. Follow up instructions and reasons to return to ER reviewed. Patient denies needs at time of discharge.      Orvan Seen, RN  03/23/21 [phone]

## 2021-03-23 NOTE — ED Provider Notes (Signed)
[facility] ED  eMERGENCYdEPARTMENT eNCOUnter      Pt Name: Tiffany Wolfe  MRN: [id number]  Birthdate 1951-09-07  Date of evaluation: 03/23/2021  Provider:Jamariya Davidoff Jacques Navy, MD    CHIEF COMPLAINT           HPI  [name] is a 70 y.o. female per chart review has a h/o fibromyalgia, HTN, Hpl, hypothyroidism presents to the ED with dog bite.  Pt was bit by her family dog 1 hour prior to arrival.  Pt notes moderate, constant, throbbing, L thumb pain.  +Laceration.  Pt denies fever, n/v, cp, sob, ab pain, dysuria, diarrhea.      ROS  Review of Systems   Constitutional: Negative for activity change, chills and fever.   HENT: Negative for ear pain and sore throat.    Eyes: Negative for visual disturbance.   Respiratory: Negative for cough and shortness of breath.    Cardiovascular: Negative for chest pain, palpitations and leg swelling.   Gastrointestinal: Negative for abdominal pain, diarrhea, nausea and vomiting.   Genitourinary: Negative for dysuria.   Musculoskeletal: Negative for back pain.        L thumb pain, lacerations   Skin: Negative for rash.   Neurological: Negative for dizziness and weakness.       Except as noted above the remainder of the review of systems was reviewed and negative.       PAST MEDICAL HISTORY     Past Medical History:   Diagnosis Date   . Fibromyalgia    . Hyperlipidemia    . Hypertension    . Hypothyroidism          SURGICAL HISTORY       Past Surgical History:   Procedure Laterality Date   . CHOLECYSTECTOMY     . HYSTERECTOMY, VAGINAL     . SPINAL FUSION           CURRENTMEDICATIONS       Discharge Medication List as of 03/23/2021  4:07 PM      CONTINUE these medications which have NOT CHANGED    Details   !! levothyroxine (SYNTHROID) 200 MCG tablet TAKE 1 TABLET BY MOUTH ONCE DAILY, Disp-30 tablet, R-2Normal      gabapentin (NEURONTIN) 300 MG capsule gabapentin 300 mg capsule   TAKE 1 CAPSULE BY MOUTH ONCE DAILY AT BEDTIME FOR 90 DAYSHistorical Med      !! levothyroxine (SYNTHROID) 25  MCG tablet Take 1 tablet by mouth Daily, Disp-90 tablet, R-0Normal      metoprolol succinate (TOPROL XL) 50 MG extended release tablet Take 1 tablet by mouth daily, Disp-90 tablet, R-1Normal      simvastatin (ZOCOR) 10 MG tablet Take 1 tablet by mouth daily, Disp-90 tablet, R-1Normal      Melatonin 1 MG CAPS melatonin   20 MG prn at bedtimeHistorical Med      esomeprazole (NEXIUM) 40 MG delayed release capsule Nexium 40 mg capsule,delayed release   Take 1 capsule every day by oral route for 90 days.Historical Med      hydrOXYzine (ATARAX) 50 MG tablet as needed Historical Med      ibuprofen (ADVIL;MOTRIN) 800 MG tablet Historical Med      DULoxetine (CYMBALTA) 60 MG extended release capsule Take 30 mg by mouth daily Historical Med       !! - Potential duplicate medications found. Please discuss with provider.          ALLERGIES     Seasonal  FAMILY HISTORY       Family History   Problem Relation Age of Onset   . Mult Sclerosis Brother    . Cancer Brother           SOCIAL HISTORY       Social History     Socioeconomic History   . Marital status: Married     Spouse name: None   . Number of children: None   . Years of education: None   . Highest education level: None   Occupational History   . None   Tobacco Use   . Smoking status: Former Smoker     Packs/day: 2.00     Years: 30.00     Pack years: 60.00     Types: Cigarettes     Quit date: 03/31/2006     Years since quitting: 14.9   . Smokeless tobacco: Never Used   Vaping Use   . Vaping Use: Never used   Substance and Sexual Activity   . Alcohol use: Yes     Alcohol/week: 0.0 standard drinks   . Drug use: No   . Sexual activity: Yes   Other Topics Concern   . None   Social History Narrative   . None     Social Determinants of Health     Financial Resource Strain:    . Difficulty of Paying Living Expenses: Not on file   Food Insecurity:    . Worried About Programme researcher, broadcasting/film/video in the Last Year: Not on file   . Ran Out of Food in the Last Year: Not on file    Transportation Needs:    . Lack of Transportation (Medical): Not on file   . Lack of Transportation (Non-Medical): Not on file   Physical Activity:    . Days of Exercise per Week: Not on file   . Minutes of Exercise per Session: Not on file   Stress:    . Feeling of Stress : Not on file   Social Connections:    . Frequency of Communication with Friends and Family: Not on file   . Frequency of Social Gatherings with Friends and Family: Not on file   . Attends Religious Services: Not on file   . Active Member of Clubs or Organizations: Not on file   . Attends Banker Meetings: Not on file   . Marital Status: Not on file   Intimate Partner Violence:    . Fear of Current or Ex-Partner: Not on file   . Emotionally Abused: Not on file   . Physically Abused: Not on file   . Sexually Abused: Not on file   Housing Stability:    . Unable to Pay for Housing in the Last Year: Not on file   . Number of Places Lived in the Last Year: Not on file   . Unstable Housing in the Last Year: Not on file         PHYSICAL EXAM       ED Triage Vitals [03/23/21 1517]   BP Temp Temp src Heart Rate Resp SpO2 Height Weight   (!) 144/86 98.1 F (36.7 C) -- 68 18 98 % 5\' 6"  (1.676 m) 165 lb (74.8 kg)       Physical Exam  Vitals and nursing note reviewed.   Constitutional:       Appearance: She is well-developed.   HENT:      Head: Normocephalic.  Right Ear: External ear normal.      Left Ear: External ear normal.   Eyes:      Conjunctiva/sclera: Conjunctivae normal.      Pupils: Pupils are equal, round, and reactive to light.   Cardiovascular:      Rate and Rhythm: Normal rate and regular rhythm.      Heart sounds: Normal heart sounds.   Pulmonary:      Effort: Pulmonary effort is normal.      Breath sounds: Normal breath sounds.   Abdominal:      General: Bowel sounds are normal. There is no distension.      Palpations: Abdomen is soft.      Tenderness: There is no abdominal tenderness.   Musculoskeletal:         General:  Normal range of motion.      Cervical back: Normal range of motion and neck supple.      Comments: 3 1 cm lacerations noted on L thumb.  Normal cap refill of L thumb.    Skin:     General: Skin is warm and dry.   Neurological:      Mental Status: She is alert and oriented to person, place, and time.   Psychiatric:         Mood and Affect: Mood normal.           MDM  70 yo female presents to the ED with L thumb dog bite.  Pt is afebrile, hemodynamically stable.  Pt given PO percocet, PO augmentin in the ED.  Pt educated about dog bites.  Pt's wound cleaned in the ED.  Pt given prescription for norco, augmentin.  Pt given dog bite warning signs and will f/u with pcp.           FINAL IMPRESSION      1. Dog bite of left thumb, initial encounter          DISPOSITION/PLAN   DISPOSITION Decision To Discharge 03/23/2021 03:52:05 PM        DISCHARGE MEDICATIONS:  @EDPTMEDSTART @         , MD(electronically signed)  Attending Emergency Physician            Cristie Hem, MD  03/23/21 [phone]

## 2021-03-23 NOTE — ED Triage Notes (Signed)
Patient in with a dog bite to her left thumb.

## 2021-03-23 NOTE — Discharge Instructions (Addendum)
Continuity of Care Form    Patient Name: Tiffany Wolfe   DOB:  [birth year]  MRN:  [id number]    Admit date:  03/23/2021  Discharge date:  ***    Code Status Order: No Order   Advance Directives:      Admitting Physician:  No admitting provider for patient encounter.  PCP: Fredrik Rigger, PA    Discharging Nurse: [facility] Unit/Room#: 05/05  Discharging Unit Phone Number: ***    Emergency Contact:   Extended Emergency Contact Information  Primary Emergency Contact: Tiffany Wolfe, Tiffany Wolfe  Home Phone: [phone]  Mobile Phone: [phone]  Relation: Spouse    Past Surgical History:  Past Surgical History:   Procedure Laterality Date    CHOLECYSTECTOMY      HYSTERECTOMY, VAGINAL      SPINAL FUSION         Immunization History:   Immunization History   Administered Date(s) Administered    COVID-19, Moderna, Primary or Immunocompromised, PF, 128mcg/0.5mL 02/14/2020, 03/23/2020    Influenza Vaccine, unspecified formulation 08/21/2014    Influenza Virus Vaccine 08/21/2014, 07/23/2015, 07/20/2016, 07/20/2018    Influenza, High Dose (Fluzone 65 yrs and older) 07/20/2013    Influenza, Quadv, IM, PF (6 mo and older Fluzone, Flulaval, Fluarix, and 3 yrs and older Afluria) 08/21/2014    Influenza, Triv, inactivated, subunit, adjuvanted, IM (Fluad 65 yrs and older) 09/01/2018    Pneumococcal Conjugate 7-valent (Prevnar7) 11/03/2017    Pneumococcal Polysaccharide (Pneumovax23) 03/25/2013    Tdap (Boostrix, Adacel) 03/28/2014    Varicella (Varivax) 06/20/2013    Zoster Live (Zostavax) 03/25/2013, 06/20/2013       Active Problems:  Patient Active Problem List   Diagnosis Code    Cervical spondylosis without myelopathy M47.812    Astigmatism with presbyopia H52.209, H52.4    Fibromyalgia M79.7    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    Fatty liver K76.0    Fibromyositis M79.7    Floaters H43.399    GERD (gastroesophageal reflux disease) K21.9    HLD (hyperlipidemia) E78.5    Hypertension  I10    Hypothyroid E03.9    Migraine with aura and without status migrainosus, not intractable G43.109    Pain in right eye H57.11    Urinary tract infectious disease N39.0    Urinary incontinence R32    Pterygium of left eye H11.002    Pseudophakia Z96.1    Pulmonary emphysema (HCC) J43.9    Prediabetes R73.03    Diastolic dysfunction I51.89    Dyspnea due to COVID-19 U07.1, R06.00       Isolation/Infection:   Isolation            No Isolation          Patient Infection Status       None to display            Nurse Assessment:  Last Vital Signs: BP (!) 144/86    Pulse 68    Temp 98.1 ??F (36.7 ??C)    Resp 18    Ht 5\' 6"  (1.676 m)    Wt 165 lb (74.8 kg)    SpO2 98%    BMI 26.63 kg/m??     Last documented pain score (0-10 scale): Pain Level: 6  Last Weight:   Wt Readings from Last 1 Encounters:   03/23/21 165 lb (74.8 kg)     Mental Status:  {IP PT MENTAL STATUS:20030}    IV Access:  {MH  COC IV ACCESS:304088262}    Nursing Mobility/ADLs:  Walking   {CHP DME YIRS:854627035}  Transfer  {CHP DME KKXF:818299371}  Bathing  {CHP DME IRCV:893810175}  Dressing  {CHP DME ZWCH:852778242}  Toileting  {CHP DME PNTI:144315400}  Feeding  {CHP DME QQPY:195093267}  Med Admin  {CHP DME TIWP:809983382}  Med Delivery   {MH COC MED Delivery:304088264}    Wound Care Documentation and Therapy:        Elimination:  Continence:   Bowel: {YES / NK:53976}  Bladder: {YES / BH:41937}  Urinary Catheter: {Urinary Catheter:304088013}   Colostomy/Ileostomy/Ileal Conduit: {YES / TK:24097}       Date of Last BM: ***  No intake or output data in the 24 hours ending 03/23/21 1605  No intake/output data recorded.    Safety Concerns:     {MH COC Safety Concerns:304088272}    Impairments/Disabilities:      {MH COC Impairments/Disabilities:304088273}    Nutrition Therapy:  Current Nutrition Therapy:   {MH COC Diet List:304088271}    Routes of Feeding: {CHP DME Other Feedings:304088042}  Liquids: {Slp liquid thickness:30034}  Daily Fluid Restriction: {CHP DME  Yes amt example:304088041}  Last Modified Barium Swallow with Video (Video Swallowing Test): {Done Not Done DZHG:992426834}    Treatments at the Time of Hospital Discharge:   Respiratory Treatments: ***  Oxygen Therapy:  {Therapy; copd oxygen:17808}  Ventilator:    {MH CC Vent HDQQ:229798921}    Rehab Therapies: {THERAPEUTIC INTERVENTION:628 718 9095}  Weight Bearing Status/Restrictions: {MH CC Weight Bearing:304508812}  Other Medical Equipment (for information only, NOT a DME order):  {EQUIPMENT:304520077}  Other Treatments: ***    Patient's personal belongings (please select all that are sent with patient):  {CHP DME Belongings:304088044}    RN SIGNATURE:  {Esignature:304088025}    CASE MANAGEMENT/SOCIAL WORK SECTION    Inpatient Status Date: ***    Readmission Risk Assessment Score:  Readmission Risk              Risk of Unplanned Readmission:  0           Discharging to Facility/ Agency   Name:   Address:  Phone:  Fax:    Dialysis Facility (if applicable)   Name:  Address:  Dialysis Schedule:  Phone:  Fax:    Case Manager/Social Worker signature: {Esignature:304088025}    PHYSICIAN SECTION    Prognosis: {Prognosis:440 347 5194}    Condition at Discharge: {MH Patient Condition:304088024}    Rehab Potential (if transferring to Rehab): {Prognosis:440 347 5194}    Recommended Labs or Other Treatments After Discharge: ***    Physician Certification: I certify the above information and transfer of Tiffany Wolfe  is necessary for the continuing treatment of the diagnosis listed and that she requires {Admit to Appropriate Level of Care:20763} for {GREATER/LESS:304500278} 30 days.     Update Admission H&P: {CHP DME Changes in JHERD:408144818}    PHYSICIAN SIGNATURE:  {Esignature:304088025}

## 2021-03-23 NOTE — Discharge Instructions (Signed)
Animal Bites: Care Instructions  Overview  After an animal bite, the biggest concern is infection. The chance of infection depends on the type of animal that bit you, where on your body you were bitten, and your general health. Many animal bites are not closed with stitches,because this can increase the chance of infection.  Your bite may take as little as 7 days or as long as several months to heal, depending on how bad it is. Taking good care of your wound at home will help itheal and reduce your chance of infection.  The doctor has checked you carefully, but problems can develop later. If you notice any problems or new symptoms, get medical treatment right away.  Follow-up care is a key part of your treatment and safety. Be sure to make and go to all appointments, and call your doctor if you are having problems. It's also a good idea to know your test results and keep alist of the medicines you take.  How can you care for yourself at home?  . If your doctor told you how to care for your wound, follow your doctor's instructions. If you did not get instructions, follow this general advice:  ? After 24 to 48 hours, gently wash the wound with clean water 2 times a day. Do not scrub or soak the wound. Don't use hydrogen peroxide or alcohol, which can slow healing.  ? You may cover the wound with a thin layer of petroleum jelly, such as Vaseline, and a nonstick bandage.  ? Apply more petroleum jelly and replace the bandage as needed.  . After you shower, gently dry the wound with a clean towel.  . If your doctor has closed the wound, cover the bandage with a plastic bag before you take a shower.  . A small amount of skin redness and swelling around the wound edges and the stitches or staples is normal. Your wound may itch or feel irritated. Do not scratch or rub the wound.  . Ask your doctor if you can take an over-the-counter pain medicine, such as acetaminophen (Tylenol), ibuprofen (Advil, Motrin), or naproxen  (Aleve). Read and follow all instructions on the label.  . Do not take two or more pain medicines at the same time unless the doctor told you to. Many pain medicines have acetaminophen, which is Tylenol. Too much acetaminophen (Tylenol) can be harmful.  . If your bite puts you at risk for rabies, you will get a series of shots over the next few weeks to prevent rabies. Your doctor will tell you when to get the shots. It is very important that you get the full cycle of shots. Follow your doctor's instructions exactly.  . You may need a tetanus shot if you have not received one in the last 5 years.  . If your doctor prescribed antibiotics, take them as directed. Do not stop taking them just because you feel better. You need to take the full course of antibiotics.  When should you call for help?   Call your doctor now or seek immediate medical care if:   Marland Kitchen The skin near the bite turns cold or pale or it changes color.    . You lose feeling in the area near the bite, or it feels numb or tingly.    . You have trouble moving a limb near the bite.    . You have symptoms of infection, such as:  ? Increased pain, swelling, warmth, or redness near the wound.  ?  Red streaks leading from the wound.  ? Pus draining from the wound.  ? A fever.    . Blood soaks through the bandage. Oozing small amounts of blood is normal.    . Your pain is getting worse.   Watch closely for changes in your health, and be sure to contact your doctor ifyou are not getting better as expected.  Where can you learn more?  Go to [email or web address] and sign in to your MyChart account. Enter [id number] in the Search Health Information box to learn more about "Animal Bites: Care Instructions."     If you do not have an account, please click on the "Sign Up Now" link.  Current as of: July 1, 2021Content Version: 13.2   2006-2022 [employer].   Care instructions adapted under license by [facility]. If you  have questions about a medical condition or this instruction, always ask your healthcare professional. [employer] disclaims any warranty or liability for your use of this information.

## 2021-09-10 ENCOUNTER — Ambulatory Visit: Admit: 2021-09-10 | Discharge: 2021-09-10 | Payer: MEDICARE | Attending: Family

## 2021-09-10 DIAGNOSIS — J0140 Acute pansinusitis, unspecified: Secondary | ICD-10-CM

## 2021-09-10 MED ORDER — AMOXICILLIN-POT CLAVULANATE 875-125 MG PO TABS
875-125 MG | ORAL_TABLET | Freq: Two times a day (BID) | ORAL | 0 refills | Status: AC
Start: 2021-09-10 — End: 2021-09-20

## 2021-09-10 MED ORDER — FLUTICASONE PROPIONATE 50 MCG/ACT NA SUSP
50 MCG/ACT | Freq: Every day | NASAL | 1 refills | Status: AC
Start: 2021-09-10 — End: 2022-09-23

## 2021-09-10 MED ORDER — CETIRIZINE HCL 10 MG PO TABS
10 MG | ORAL_TABLET | Freq: Every day | ORAL | 0 refills | Status: AC
Start: 2021-09-10 — End: 2021-10-10

## 2021-09-10 NOTE — Progress Notes (Signed)
Tiffany Wolfe (DOB:  [birth year]) is a 70 y.o. female, Established patient, here for evaluation of the following chief complaint(s):  Other (Sinus drainage and pressure, throat tickle from all the coughing ongoing 1 month)      Vitals:    09/10/21 1145   BP: 118/78   Pulse: 62   Temp: 97.8 ??F (36.6 ??C)   SpO2: 97%       ASSESSMENT/PLAN:  1. Acute non-recurrent pansinusitis  -     cetirizine (ZYRTEC) 10 MG tablet; Take 1 tablet by mouth daily, Disp-30 tablet, R-0Normal  -     fluticasone (FLONASE) 50 MCG/ACT nasal spray; 1 spray by Nasal route daily, Disp-1 each, R-1Normal  -     amoxicillin-clavulanate (AUGMENTIN) 875-125 MG per tablet; Take 1 tablet by mouth 2 times daily for 10 days, Disp-20 tablet, R-0Normal  2. Bilateral acute serous otitis media, recurrence not specified  -     cetirizine (ZYRTEC) 10 MG tablet; Take 1 tablet by mouth daily, Disp-30 tablet, R-0Normal  -     fluticasone (FLONASE) 50 MCG/ACT nasal spray; 1 spray by Nasal route daily, Disp-1 each, R-1Normal      Return if symptoms worsen or fail to improve.      SUBJECTIVE/OBJECTIVE:    Sinusitis  This is a new problem. The current episode started more than 1 month ago. The problem is unchanged. There has been no fever. She is experiencing no pain. Associated symptoms include congestion, coughing, ear pain (bilateral), sinus pressure and a sore throat. Pertinent negatives include no shortness of breath. Treatments tried: alkaseltzer mucinex,sudafed. The treatment provided mild relief.       Review of Systems   HENT:  Positive for congestion, ear pain (bilateral), postnasal drip, rhinorrhea, sinus pressure and sore throat.    Eyes:  Negative for discharge, redness and itching.   Respiratory:  Positive for cough. Negative for shortness of breath and wheezing.    Cardiovascular:  Negative for palpitations.   Neurological:  Negative for dizziness and light-headedness.       Physical Exam  Vitals reviewed.   Constitutional:       General: She is not in  acute distress.     Appearance: Normal appearance.   HENT:      Right Ear: A middle ear effusion is present. Tympanic membrane is not erythematous.      Left Ear: A middle ear effusion is present. Tympanic membrane is not erythematous.      Nose: Mucosal edema present.      Right Turbinates: Swollen.      Left Turbinates: Swollen.      Right Sinus: Maxillary sinus tenderness and frontal sinus tenderness present.      Left Sinus: Maxillary sinus tenderness and frontal sinus tenderness present.      Mouth/Throat:      Lips: Pink.      Mouth: Mucous membranes are moist.      Pharynx: Oropharynx is clear. No oropharyngeal exudate or posterior oropharyngeal erythema.   Cardiovascular:      Rate and Rhythm: Normal rate and regular rhythm.      Heart sounds: Normal heart sounds, S1 normal and S2 normal.   Pulmonary:      Effort: Pulmonary effort is normal. No respiratory distress.      Breath sounds: Normal air entry. No decreased breath sounds, wheezing, rhonchi or rales.   Skin:     General: Skin is warm and dry.   Neurological:  Mental Status: She is alert and oriented to person, place, and time.   Psychiatric:         Mood and Affect: Mood normal.         Behavior: Behavior is cooperative.             An electronic signature was used to authenticate this note.    --Higinio Roger, APRN

## 2021-12-02 IMAGING — CT CT LUNG SCREENING
1 of 3 series · 4 of 36 positions shown, 5 images · non-contrast
Comparison: Chest CT December 16, 2018

________________________________________________________________________________________________ 
CT LUNG SCREENING, 12/02/2021 [DATE]: 
CLINICAL INDICATION:   30 pack year history 
A search for DICOM formatted images was conducted for prior CT imaging studies 
completed at a non-affiliated media free facility.
TECHNIQUE: Screening computed tomographic images of the chest are performed from 
the thoracic outlet through the diaphragms without intravenous contrast 
utilizing dose reduction technique. The patients dose for this study is
mGy.

[Series 4: coronal · coronal · 0.60mm/px · 4 of 149 slices shown, 5 images]
[im 30/149  mediastinal]
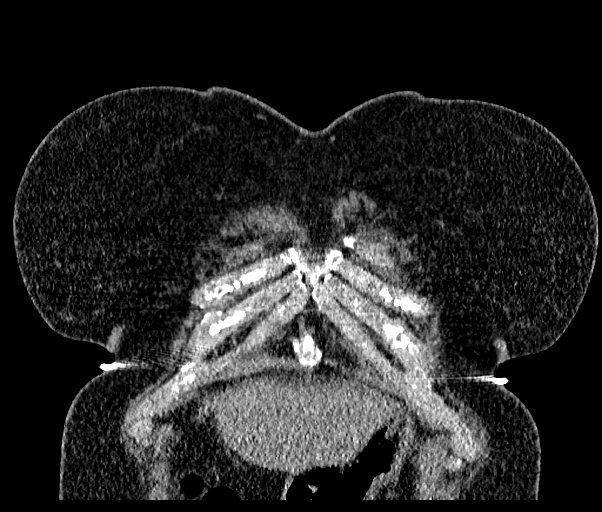
[im 30/149  lung]
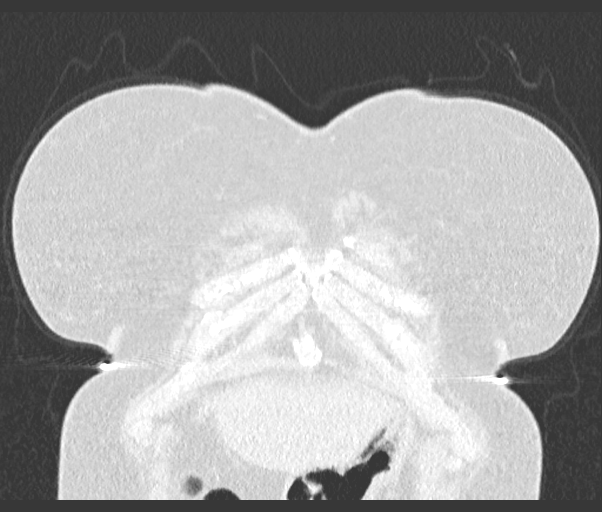
[im 60/149  lung]
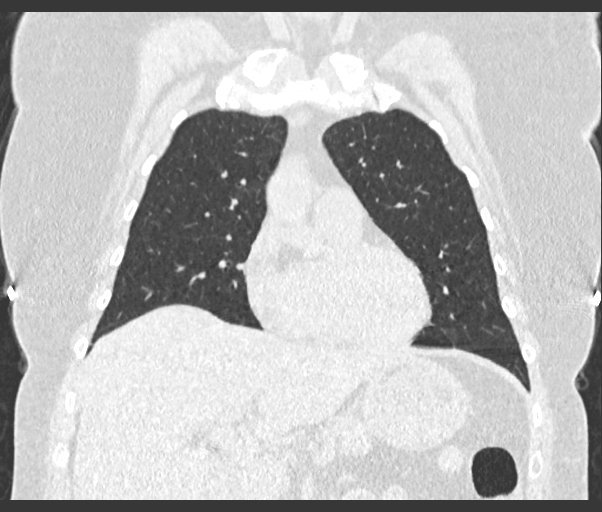
[im 89/149  lung]
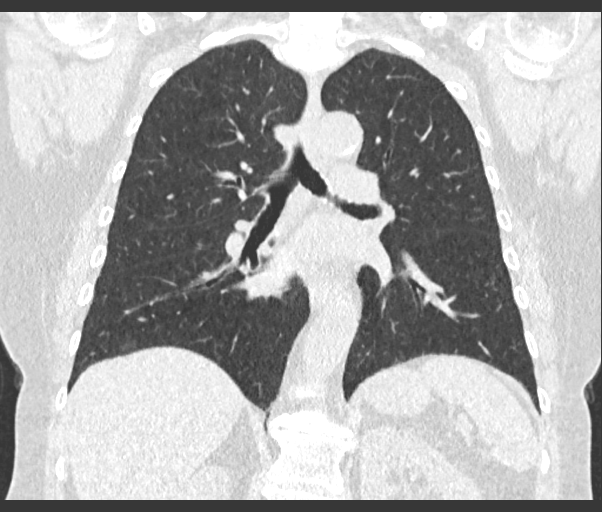
[im 119/149  lung]
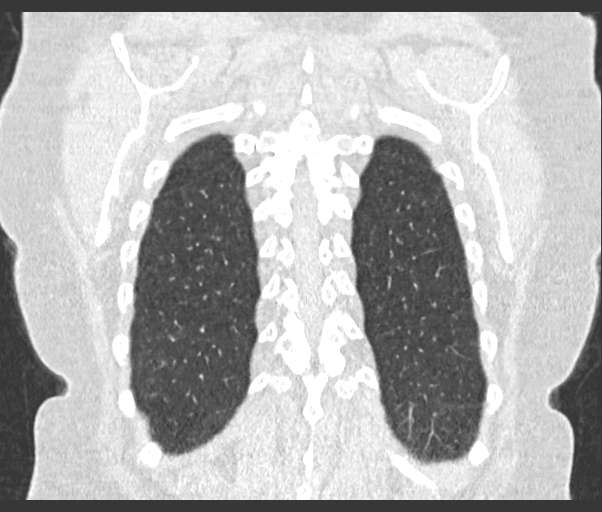

[4 of 36 positions shown; findings below may reference images not displayed]

FINDINGS: 2 mm right middle lobe nodule, image 80 is unchanged. Prior study 
described 3 mm right middle lobe nodule which is not identified on either 
examination. No new noncalcified nodule. There is calcified granuloma left upper 
lobe. No infiltrate or effusion. Mild bronchial wall thickening. No mediastinal 
or axillary adenopathy. Small calcified left hilar lymph node. The heart is not 
enlarged. No adrenal nodule. There has been cholecystectomy.
IMPRESSION: Stable 2 mm right middle lobe nodule. No new nodule, adenopathy or pleural 
effusion. 
Chronic granulomatous changes. 
L-RADS: Category 2 (benign appearance, <1% chance of malignancy) 
Solid nodule(s) 
< 6mm 
New nodule <4 mm 
Subsolid nodule(s) 
<6 mm on baseline screening 
Ground glass nodule(s) 
<20 mm 
Greater than or equal to 20 mm and unchanged or slowly growing 
Category 3 or 4 nodules that are unchanged for Greater than or equal to 3 months 
Recommended follow up 
Category 2: continue annual screening with LDCT 

RADIATION DOSE REDUCTION: All CT scans are performed using radiation dose 
reduction techniques, when applicable.  Technical factors are evaluated and 
adjusted to ensure appropriate moderation of exposure.  Automated dose 
management technology is applied to adjust the radiation doses to minimize 
exposure while achieving diagnostic quality images.

## 2021-12-09 IMAGING — NM HEPATOBILIARY SCAN
2 series · 18 of 18 positions shown · non-contrast
Comparison: Abdominal ultrasound 12/10/2020

________________________________________________________________________________________________ 
HEPATOBILIARY SCAN, 12/09/2021 [DATE]: 
CLINICAL INDICATION: Unspecified abdominal pain, cholecystectomy 40 years ago, 
RIGHT-sided pain radiates to the back for 6 weeks. Cyclical constipation and 
diarrhea
TECHNIQUE: The patient was injected with 7.7 millicuries of AKMENIETIS technetium 99m 
and imaging of the right upper quadrant was performed.

[Series 1000: hida 0-(id) · 3.90mm/px · 6 of 60 frames shown (1 of 2)]
[frame 6/60]
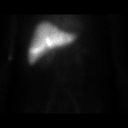
[frame 16/60]
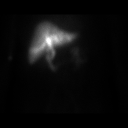
[frame 26/60]
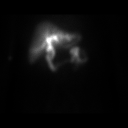
[frame 36/60]
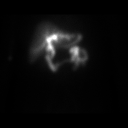
[frame 46/60]
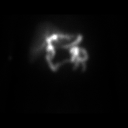
[frame 56/60]
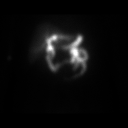

[Series 1000: hida 0-(id) · 1.95mm/px · 12 of 12 slices shown (2 of 2)]
[im 1/12]
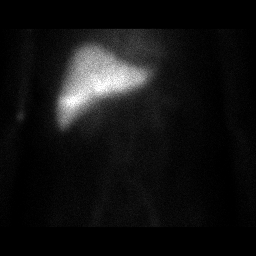
[im 2/12]
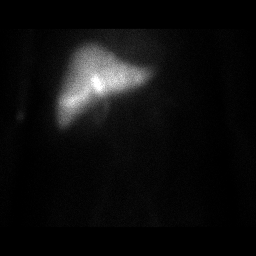
[im 3/12]
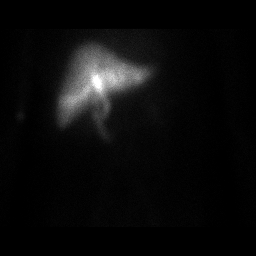
[im 4/12]
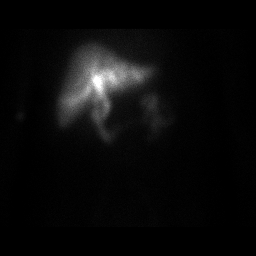
[im 5/12]
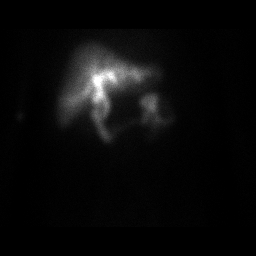
[im 6/12]
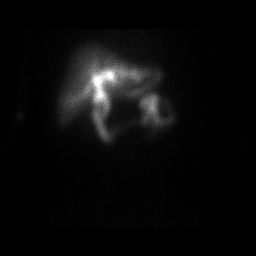
[im 7/12]
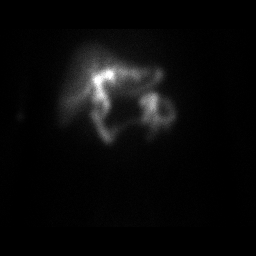
[im 8/12]
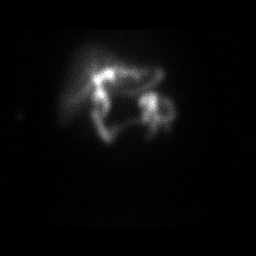
[im 9/12]
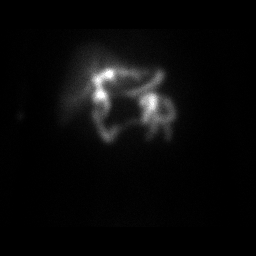
[im 10/12]
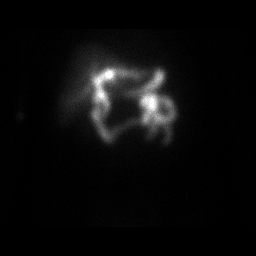
[im 11/12]
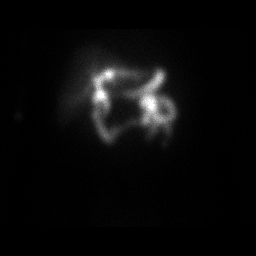
[im 12/12]
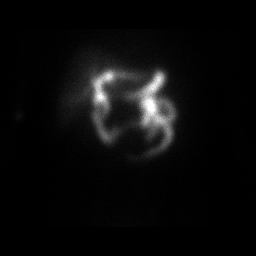

[18 of 18 positions shown; findings below may reference images not displayed]

FINDINGS: Dynamic cine images demonstrate normal uptake by the liver. Normal 
excretion into the biliary tree with visualization of the intra and extrahepatic 
ducts, and small bowel. There is significant duodenal gastric reflux. 
Nonvisualized gallbladder, history of cholecystectomy.
IMPRESSION: Significant duodenal gastric reflux which could be a contributor to/cause of 
epigastric pain. 
Previous cholecystectomy. No evidence for common duct obstruction or 
cholestasis.

## 2022-06-20 ENCOUNTER — Ambulatory Visit: Admit: 2022-06-20 | Discharge: 2022-06-20 | Payer: MEDICARE | Attending: Family

## 2022-06-20 DIAGNOSIS — J0140 Acute pansinusitis, unspecified: Secondary | ICD-10-CM

## 2022-06-20 MED ORDER — AMOXICILLIN-POT CLAVULANATE 875-125 MG PO TABS
875-125 MG | ORAL_TABLET | Freq: Two times a day (BID) | ORAL | 0 refills | Status: AC
Start: 2022-06-20 — End: 2022-06-30

## 2022-06-20 MED ORDER — FLUTICASONE PROPIONATE 50 MCG/ACT NA SUSP
50 MCG/ACT | Freq: Every day | NASAL | 1 refills | Status: DC
Start: 2022-06-20 — End: 2022-09-23

## 2022-06-20 MED ORDER — BENZONATATE 100 MG PO CAPS
100 MG | ORAL_CAPSULE | Freq: Three times a day (TID) | ORAL | 0 refills | Status: AC | PRN
Start: 2022-06-20 — End: 2022-06-30

## 2022-06-20 MED ORDER — CETIRIZINE HCL 10 MG PO TABS
10 MG | ORAL_TABLET | Freq: Every day | ORAL | 0 refills | Status: AC
Start: 2022-06-20 — End: 2022-07-20

## 2022-06-20 MED ORDER — METHYLPREDNISOLONE 4 MG PO TBPK
4 MG | PACK | ORAL | 0 refills | Status: AC
Start: 2022-06-20 — End: 2022-09-23

## 2022-06-20 NOTE — Progress Notes (Signed)
Tiffany Wolfe (DOB:  05-23-1951) is a 71 y.o. female, Established patient, here for evaluation of the following chief complaint(s):  Cough (Cough and congestion for one month. )      Vitals:    06/20/22 1157   BP: 122/78   Pulse: 70   Resp: 16   Temp: 97.6 F (36.4 C)   SpO2: 99%       ASSESSMENT/PLAN:  1. Acute non-recurrent pansinusitis  -     fluticasone (FLONASE) 50 MCG/ACT nasal spray; 1 spray by Nasal route daily, Disp-1 each, R-1Normal  -     amoxicillin-clavulanate (AUGMENTIN) 875-125 MG per tablet; Take 1 tablet by mouth 2 times daily for 10 days, Disp-20 tablet, R-0Normal  -     cetirizine (ZYRTEC) 10 MG tablet; Take 1 tablet by mouth daily, Disp-30 tablet, R-0Normal  2. Non-recurrent acute serous otitis media of both ears  -     fluticasone (FLONASE) 50 MCG/ACT nasal spray; 1 spray by Nasal route daily, Disp-1 each, R-1Normal  -     cetirizine (ZYRTEC) 10 MG tablet; Take 1 tablet by mouth daily, Disp-30 tablet, R-0Normal  3. Acute cough  -     benzonatate (TESSALON) 100 MG capsule; Take 1 capsule by mouth 3 times daily as needed for Cough, Disp-30 capsule, R-0Normal  -     methylPREDNISolone (MEDROL, PAK,) 4 MG tablet; Take by mouth., Disp-1 kit, R-0Normal        Return if symptoms worsen or fail to improve.      SUBJECTIVE/OBJECTIVE:    Cough  This is a new problem. Episode onset: x1 month cough and congestion, b/l ear pressure, wheezing. The problem has been gradually worsening. The problem occurs constantly. The cough is Productive of sputum. Associated symptoms include ear pain (b/l), postnasal drip, rhinorrhea, a sore throat and wheezing. Pertinent negatives include no chest pain, chills, eye redness, fever, headaches, myalgias or shortness of breath. The symptoms are aggravated by lying down. Her past medical history is significant for environmental allergies.       Review of Systems   Constitutional:  Negative for chills, fatigue and fever.   HENT:  Positive for congestion, ear pain (b/l),  postnasal drip, rhinorrhea, sinus pressure and sore throat.    Eyes:  Negative for discharge, redness and itching.   Respiratory:  Positive for cough and wheezing. Negative for shortness of breath.    Cardiovascular:  Negative for chest pain and palpitations.   Musculoskeletal:  Negative for arthralgias, myalgias and neck pain.   Allergic/Immunologic: Positive for environmental allergies.   Neurological:  Negative for dizziness, light-headedness and headaches.       Physical Exam  Vitals reviewed.   Constitutional:       General: She is not in acute distress.     Appearance: Normal appearance.   HENT:      Right Ear: A middle ear effusion is present. Tympanic membrane is not erythematous.      Left Ear: A middle ear effusion is present. Tympanic membrane is not erythematous.      Nose: Mucosal edema present.      Right Turbinates: Swollen.      Left Turbinates: Swollen.      Right Sinus: Maxillary sinus tenderness and frontal sinus tenderness present.      Left Sinus: Maxillary sinus tenderness and frontal sinus tenderness present.      Mouth/Throat:      Lips: Pink.      Mouth: Mucous membranes are moist.  Pharynx: Oropharynx is clear. No oropharyngeal exudate (post nasal drip) or posterior oropharyngeal erythema.   Cardiovascular:      Rate and Rhythm: Normal rate and regular rhythm.      Heart sounds: Normal heart sounds, S1 normal and S2 normal.   Pulmonary:      Effort: Pulmonary effort is normal. No respiratory distress.      Breath sounds: Normal air entry. Examination of the right-upper field reveals decreased breath sounds. Examination of the left-upper field reveals decreased breath sounds. Examination of the right-middle field reveals decreased breath sounds. Examination of the left-middle field reveals decreased breath sounds. Decreased breath sounds present. No wheezing, rhonchi or rales.   Skin:     General: Skin is warm and dry.   Neurological:      Mental Status: She is alert and oriented to  person, place, and time.   Psychiatric:         Mood and Affect: Mood normal.         Behavior: Behavior is cooperative.             An electronic signature was used to authenticate this note.    --Higinio Roger, APRN

## 2022-08-06 NOTE — Telephone Encounter (Signed)
Formatting of this note is different from the original.  Patient has been identified by name and date of birth: Yes     Requested Prescriptions     Pending Prescriptions Disp Refills    levothyroxine (SYNTHROID) 175 mcg tablet 90 tablet 0     RX INSTRUCTIONS:  Patient aware RX will be sent to pharmacy.  No need to notify patient.    Thank you,   Raynaldo Opitz    Electronically signed by Raynaldo Opitz at 08/06/2022  2:16 PM EDT

## 2022-08-06 NOTE — Telephone Encounter (Signed)
Formatting of this note might be different from the original.  Last ov: 06/30/22  Next ov: none  Electronically signed by Chales Salmon, [place] at 08/06/2022  2:42 PM EDT

## 2022-09-23 ENCOUNTER — Ambulatory Visit: Admit: 2022-09-23 | Discharge: 2022-09-23 | Payer: MEDICARE | Attending: Family

## 2022-09-23 DIAGNOSIS — J0140 Acute pansinusitis, unspecified: Secondary | ICD-10-CM

## 2022-09-23 DIAGNOSIS — B349 Viral infection, unspecified: Secondary | ICD-10-CM

## 2022-09-23 LAB — POCT RAPID STREP A: Strep A Ag: NOT DETECTED

## 2022-09-23 LAB — POCT COVID-19, ANTIGEN
Lot Number: 3257138
SARS-COV-2, POC: NOT DETECTED

## 2022-09-23 LAB — POCT INFLUENZA A/B
Influenza A Ab: NEGATIVE
Influenza B Ab: NEGATIVE

## 2022-09-23 MED ORDER — FLUTICASONE PROPIONATE 50 MCG/ACT NA SUSP
50 MCG/ACT | Freq: Every day | NASAL | 1 refills | Status: AC
Start: 2022-09-23 — End: ?

## 2022-09-23 MED ORDER — GUAIFENESIN ER 600 MG PO TB12
600 MG | ORAL_TABLET | Freq: Two times a day (BID) | ORAL | 0 refills | Status: AC
Start: 2022-09-23 — End: 2022-10-08

## 2022-09-23 MED ORDER — CETIRIZINE HCL 10 MG PO TABS
10 MG | ORAL_TABLET | Freq: Every day | ORAL | 0 refills | Status: AC
Start: 2022-09-23 — End: 2022-10-23

## 2022-09-23 MED ORDER — AMOXICILLIN-POT CLAVULANATE 875-125 MG PO TABS
875-125 MG | ORAL_TABLET | Freq: Two times a day (BID) | ORAL | 0 refills | Status: AC
Start: 2022-09-23 — End: 2022-10-03

## 2022-09-23 MED ORDER — METHYLPREDNISOLONE 4 MG PO TBPK
4 MG | PACK | ORAL | 0 refills | Status: AC
Start: 2022-09-23 — End: ?

## 2022-09-23 NOTE — Progress Notes (Signed)
Tiffany Wolfe (DOB:  1950-12-26) is a 71 y.o. female, Established patient, here for evaluation of the following chief complaint(s):  Pharyngitis (Sore throat, headache, cough, body aches, SOB ongoing for 1 week. )      Vitals:    09/23/22 0938   BP: 112/64   Pulse: 68   Resp: 16   Temp: 97.2 F (36.2 C)   SpO2: 96%       ASSESSMENT/PLAN:  1. Acute non-recurrent pansinusitis  -     fluticasone (FLONASE) 50 MCG/ACT nasal spray; 1 spray by Nasal route daily, Disp-1 each, R-1Normal  -     cetirizine (ZYRTEC) 10 MG tablet; Take 1 tablet by mouth daily, Disp-30 tablet, R-0Normal  2. Lower respiratory infection (e.g., bronchitis, pneumonia, pneumonitis, pulmonitis)  -     amoxicillin-clavulanate (AUGMENTIN) 875-125 MG per tablet; Take 1 tablet by mouth 2 times daily for 10 days, Disp-20 tablet, R-0Normal  -     methylPREDNISolone (MEDROL, PAK,) 4 MG tablet; Take by mouth., Disp-1 kit, R-0Normal  3. Viral illness  -     POCT COVID-19, Antigen - NEG  -     POCT Influenza A/B - NEG  -     POCT rapid strep A - NEG        Return if symptoms worsen or fail to improve.      SUBJECTIVE/OBJECTIVE:    Pharyngitis  This is a new problem. Episode onset: sore throat, headache, cough, body aches, short of breath x1 week. The problem occurs constantly. The problem has been unchanged. Associated symptoms include coughing, headaches, myalgias and a sore throat. Pertinent negatives include no abdominal pain, arthralgias, chest pain, chills, congestion, fatigue, fever, nausea or vomiting. Associated symptoms comments: Shortness of breath.       Review of Systems   Constitutional:  Negative for chills, fatigue and fever.   HENT:  Positive for sore throat. Negative for congestion, ear pain and rhinorrhea.    Respiratory:  Positive for cough and shortness of breath. Negative for chest tightness and wheezing.    Cardiovascular:  Negative for chest pain, palpitations and leg swelling.   Gastrointestinal:  Negative for abdominal pain, diarrhea,  nausea and vomiting.   Musculoskeletal:  Positive for myalgias. Negative for arthralgias and back pain.   Neurological:  Positive for headaches. Negative for dizziness and light-headedness.         Physical Exam  Vitals reviewed.   Constitutional:       General: She is not in acute distress.     Appearance: Normal appearance.   HENT:      Right Ear: A middle ear effusion is present. Tympanic membrane is not erythematous.      Left Ear: A middle ear effusion is present. Tympanic membrane is not erythematous.      Nose: Mucosal edema present.      Right Turbinates: Swollen.      Left Turbinates: Swollen.      Right Sinus: Maxillary sinus tenderness and frontal sinus tenderness present.      Left Sinus: Maxillary sinus tenderness and frontal sinus tenderness present.      Mouth/Throat:      Lips: Pink.      Mouth: Mucous membranes are moist.      Pharynx: Oropharynx is clear. No oropharyngeal exudate or posterior oropharyngeal erythema.   Cardiovascular:      Rate and Rhythm: Normal rate and regular rhythm.      Heart sounds: Normal heart sounds, S1 normal and  S2 normal.   Pulmonary:      Effort: Pulmonary effort is normal. No respiratory distress.      Breath sounds: Normal air entry. Decreased breath sounds (clear) present. No wheezing, rhonchi or rales.   Skin:     General: Skin is warm and dry.   Neurological:      Mental Status: She is alert and oriented to person, place, and time.   Psychiatric:         Mood and Affect: Mood normal.         Behavior: Behavior is cooperative.         An electronic signature was used to authenticate this note.    --Higinio Roger, APRN

## 2022-11-12 IMAGING — MR MRI BRAIN W/WO CONTRAST
2 of 16 series · 5 of 48 positions shown · IV contrast (gadavist)
Comparison: Brain MRI November 26, 2020.

________________________________________________________________________________________________ 
MRI BRAIN W/WO CONTRAST, 11/12/2022 [DATE]: 
CLINICAL INDICATION: Short-term memory loss, amnesia. Headache.
TECHNIQUE: Multiplanar, multiecho position MR images of the brain were performed 
without and with 7.5 mL of Gadavist were injected intravenously by hand. 0 mL of 
Gadavist were discarded.  Patient was scanned on a 1.5T magnet.

[Series 1002: T1 post-contrast · coronal · 1.0mm · 0.24mm/px · 3 of 180 slices shown (1 of 2)]
[im 23/180]
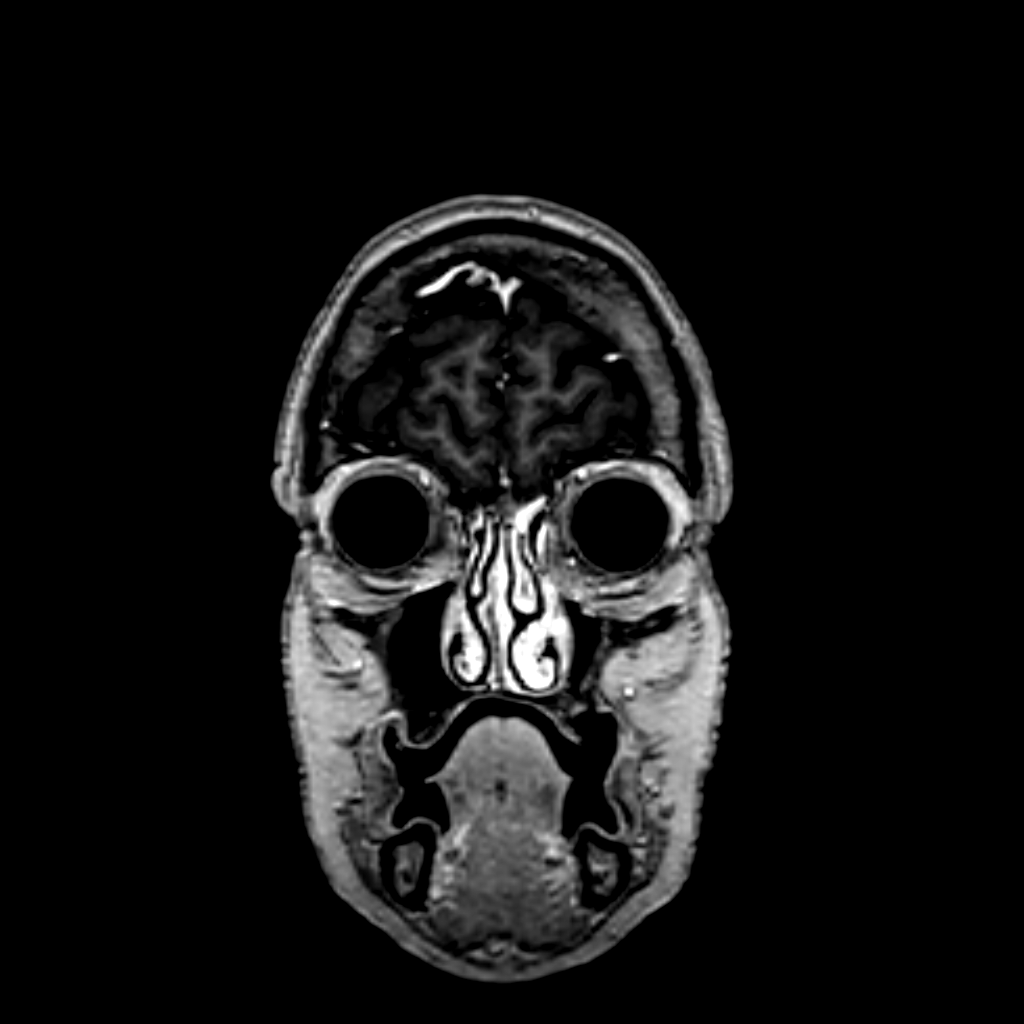
[im 90/180]
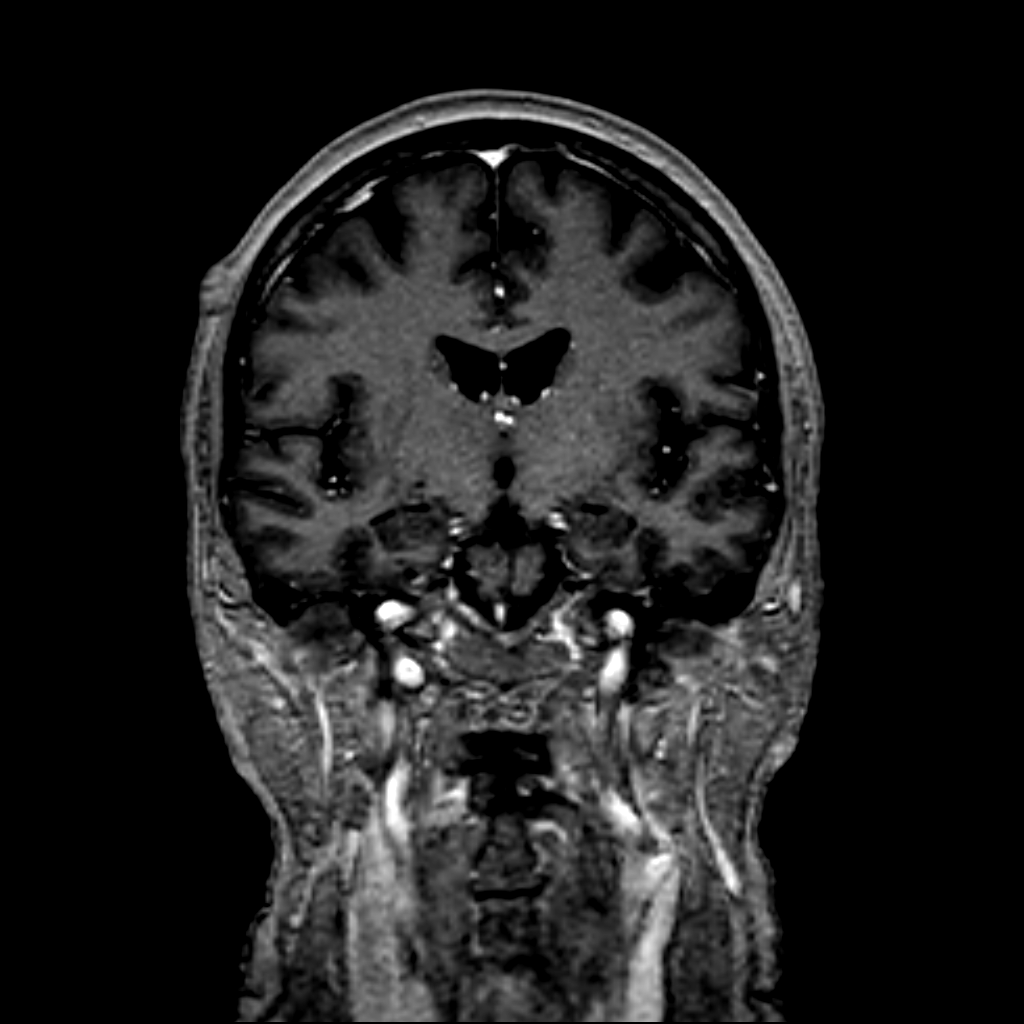
[im 157/180]
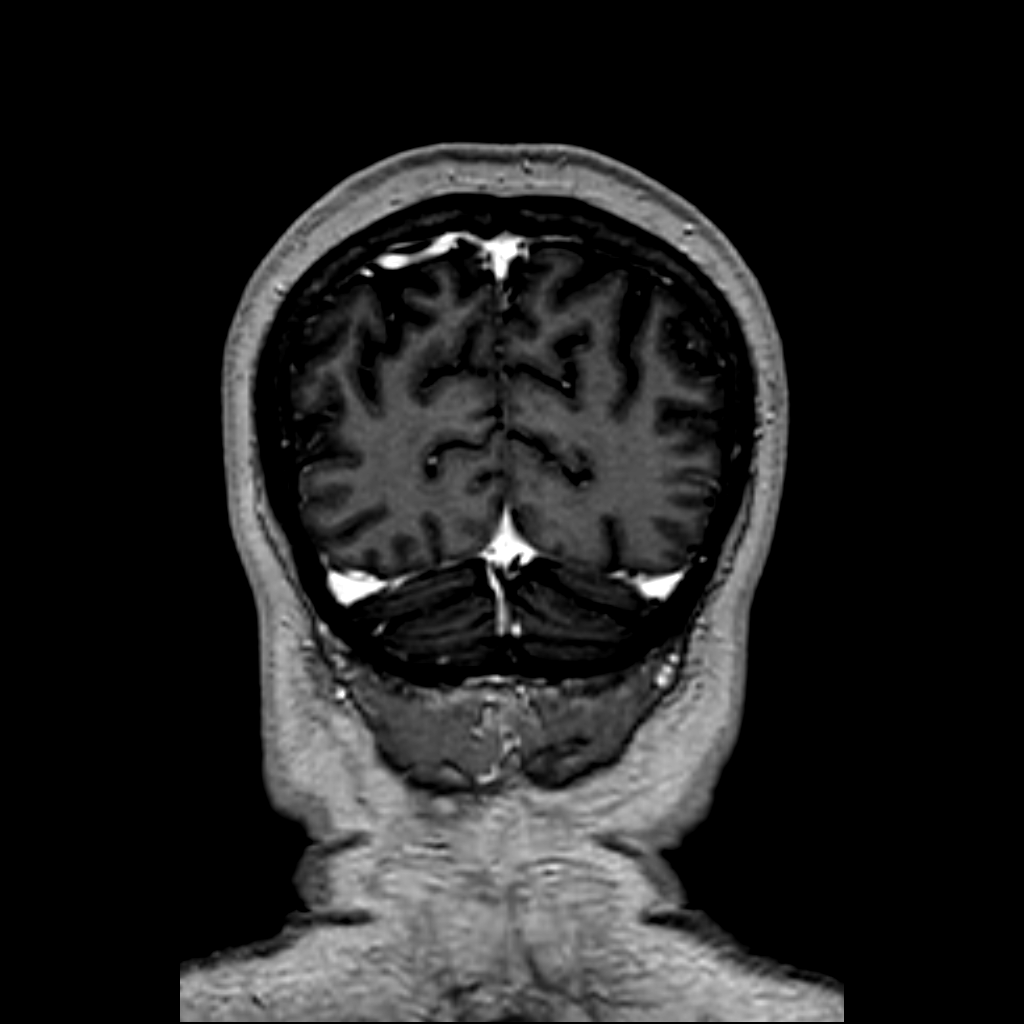

[Series 1003: T1 post-contrast · axial · 1.0mm · 0.24mm/px · z∈[-119,-48]mm · 2 of 170 slices shown (2 of 2)]
[im 25/170]
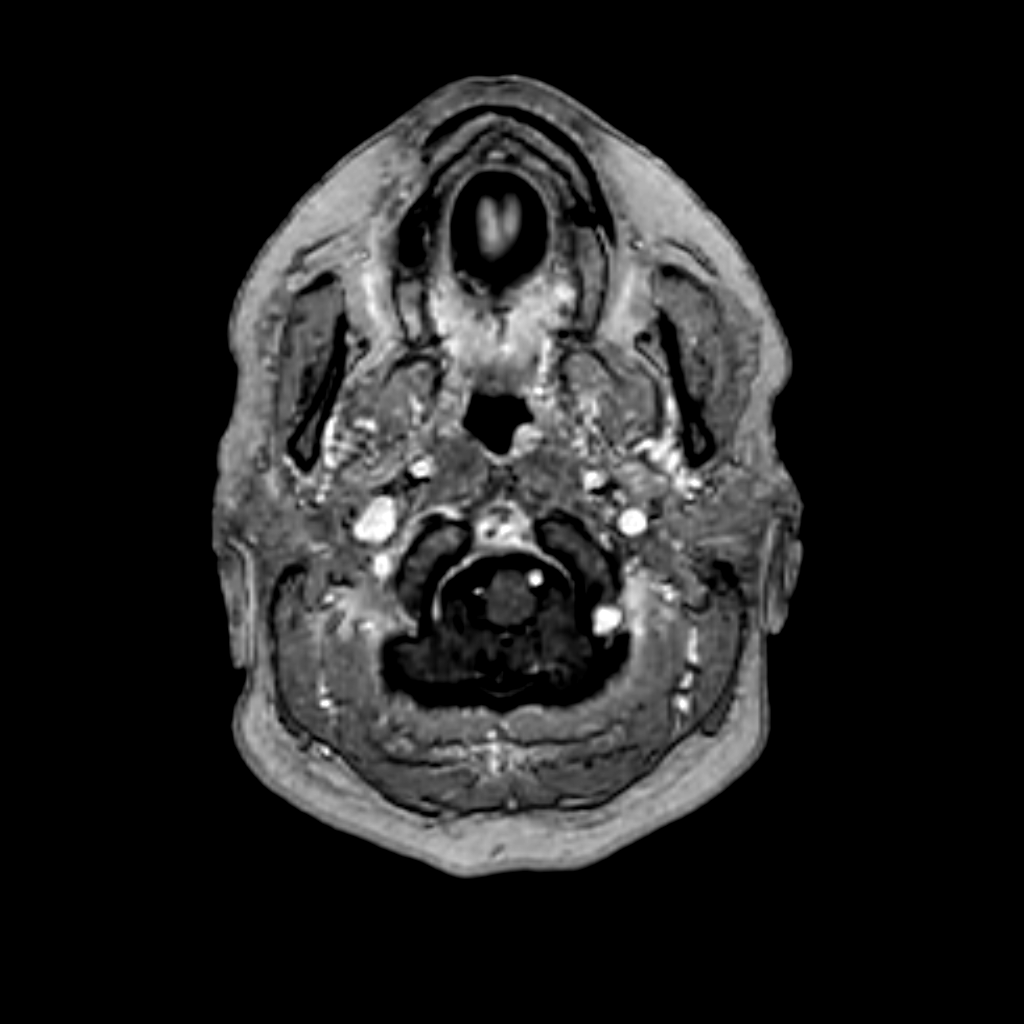
[im 97/170]
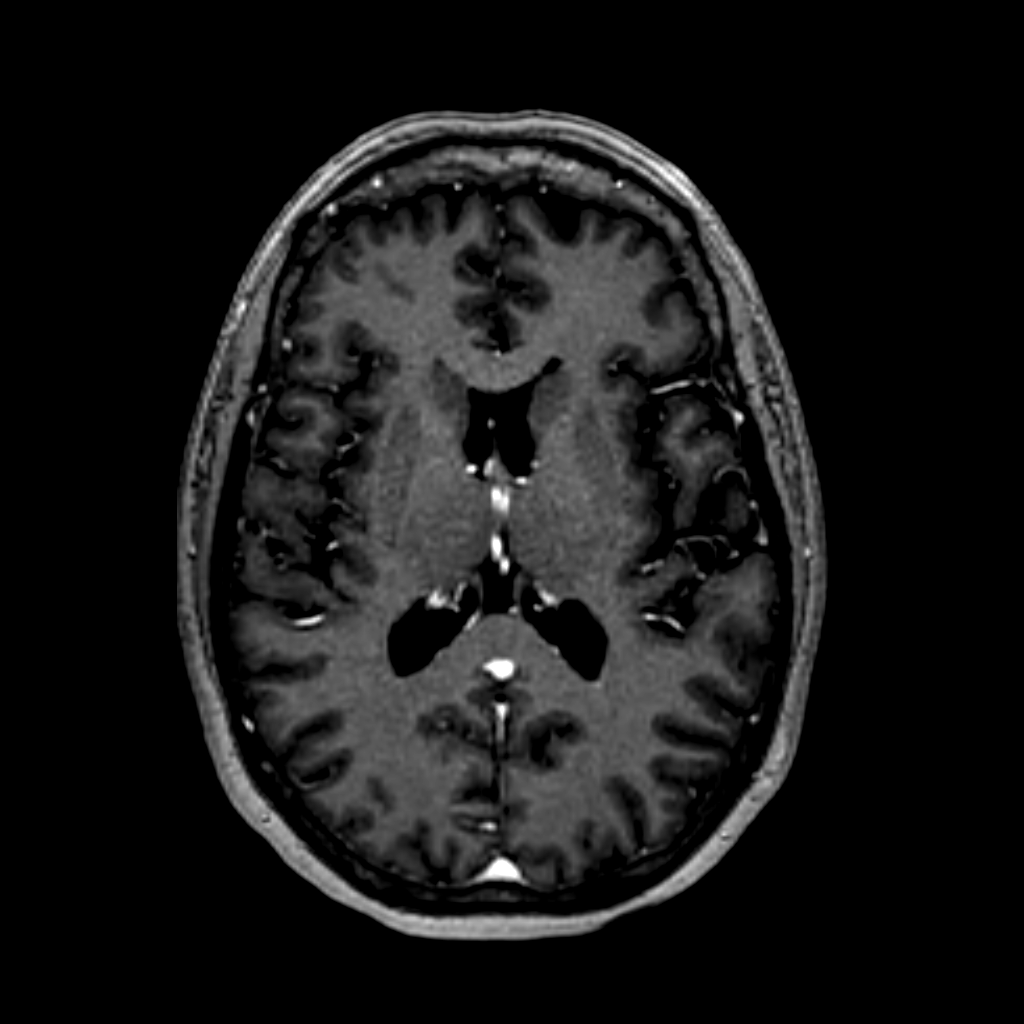

[5 of 48 positions shown; findings below may reference images not displayed]

FINDINGS: -------------------------------------------------------------------------------- 
------------------------- 
INTRACRANIAL: 
Minimal nonspecific periventricular T2 FLAIR hyperintensity. Otherwise brain 
parenchymal signal is normal. No mass or abnormal extra-axial fluid collection. 
No acute ischemia. No abnormal foci of susceptibility artifact in the brain. 
Patency of the intracranial vascular flow voids.  No acute intracranial 
hemorrhage, mass effect, midline shift. No large sellar mass. No hydrocephalus. 
Cerebral volume is age appropriate.  No pathologic contrast enhancement.  
-------------------------------------------------------------------------------- 
----------------------- 
OTHER: 
ORBITS/SINUSES/T-BONES:  Bilateral pseudophakia.  Mastoid air cells and middle 
ear cavities are grossly clear.  Visualized paranasal sinuses are clear. 
MARROW SIGNAL/SOFT TISSUES: No focal suspect signal abnormality. Stable 
appearing 11 x 10 mm right lateral scalp process which may reflect a sebaceous 
or epidermal cyst. 
-------------------------------------------------------------------------------- 
-------------------
IMPRESSION: No acute intracranial process. No mass or abnormal enhancement. Stable 
incidental findings as above.

## 2022-12-16 IMAGING — MG MAMMOGRAPHY SCREENING BILATERAL 3[PERSON_NAME]
8 series · 8 of 24 positions shown · non-contrast
Comparison: 12/16/2018 and dating back to 12/09/2017

________________________________________________________________________________________________ 
MAMMOGRAPHY SCREENING BILATERAL 3DARIEN SURIEL, 12/16/2022 [DATE]: 
CLINICAL INDICATION: Encounter for screening mammogram.
TECHNIQUE: Digital bilateral mammograms and 3-D Tomosynthesis were obtained. 
These were interpreted both primarily and with the aid of computer-aided 
detection system.  
BREAST DENSITY: (Level A) The breasts are almost entirely fatty.

[R CC]
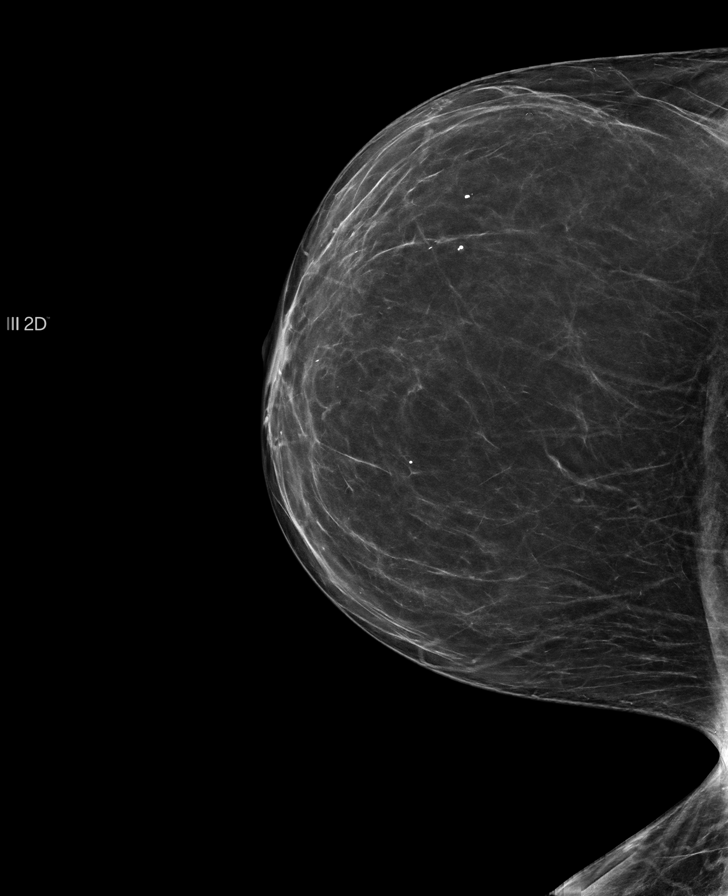

[L CC]
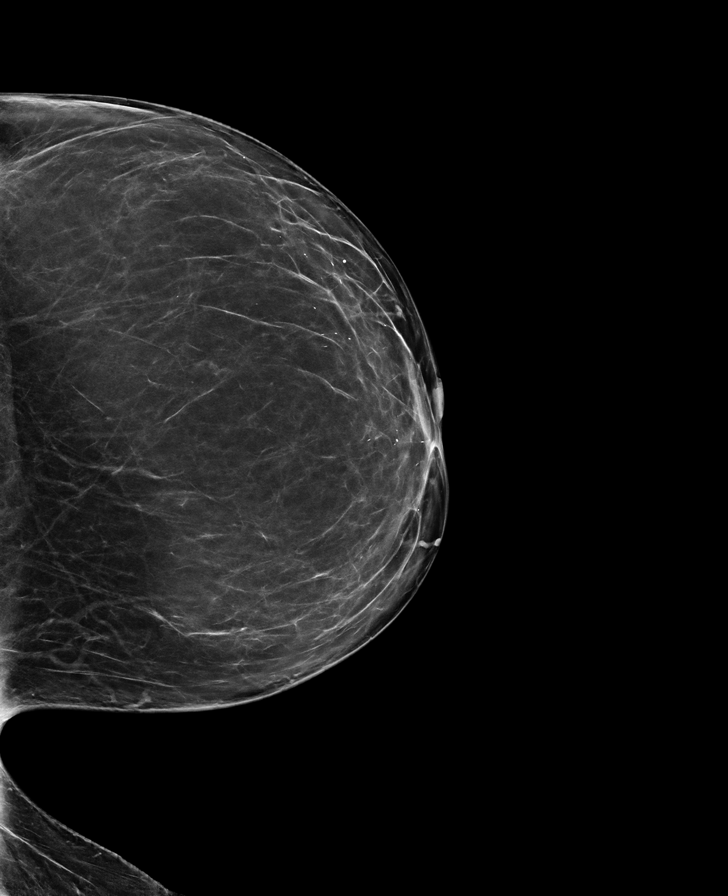

[R MLO]
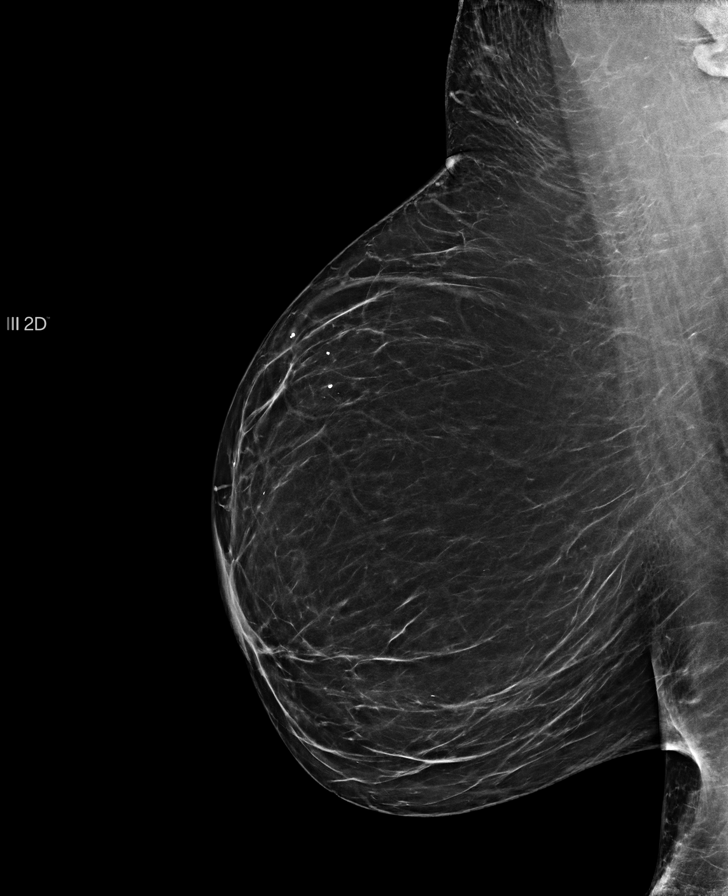

[L MLO]
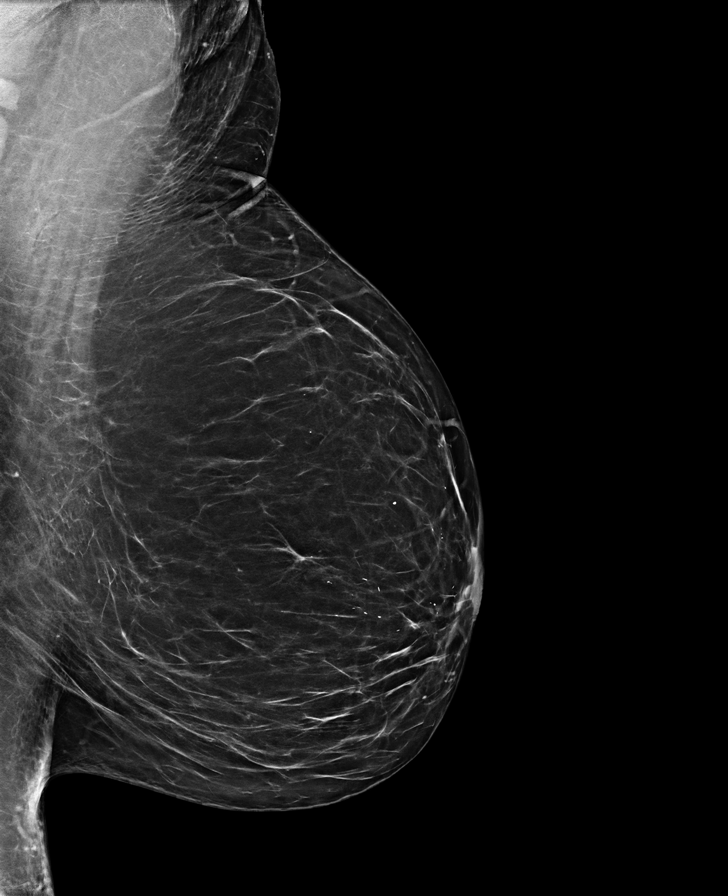

[R MLO tomo · tomo slice 41/81.0]
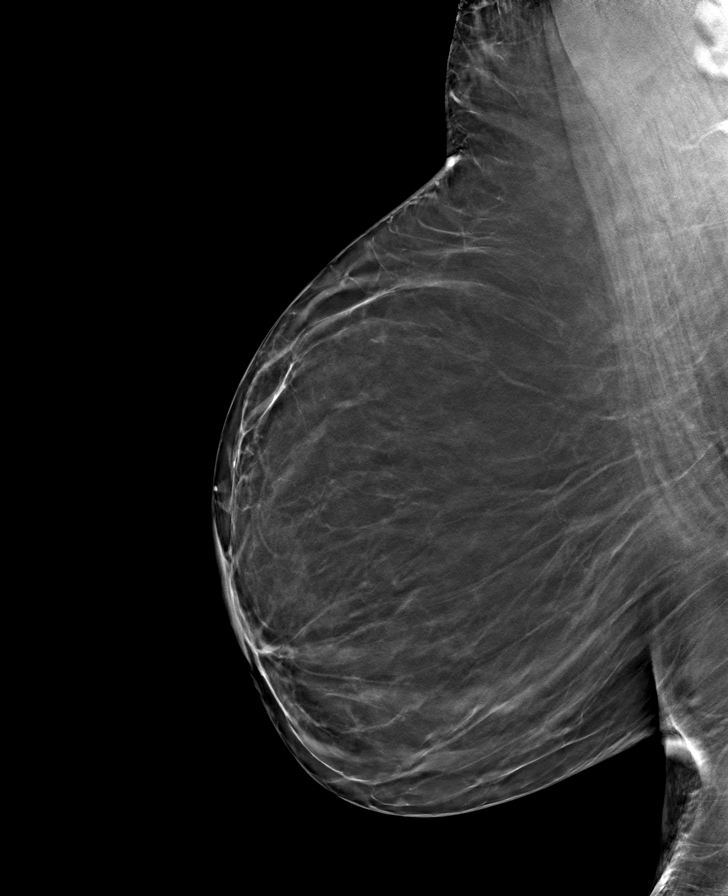

[R CC tomo · tomo slice 36/71.0]
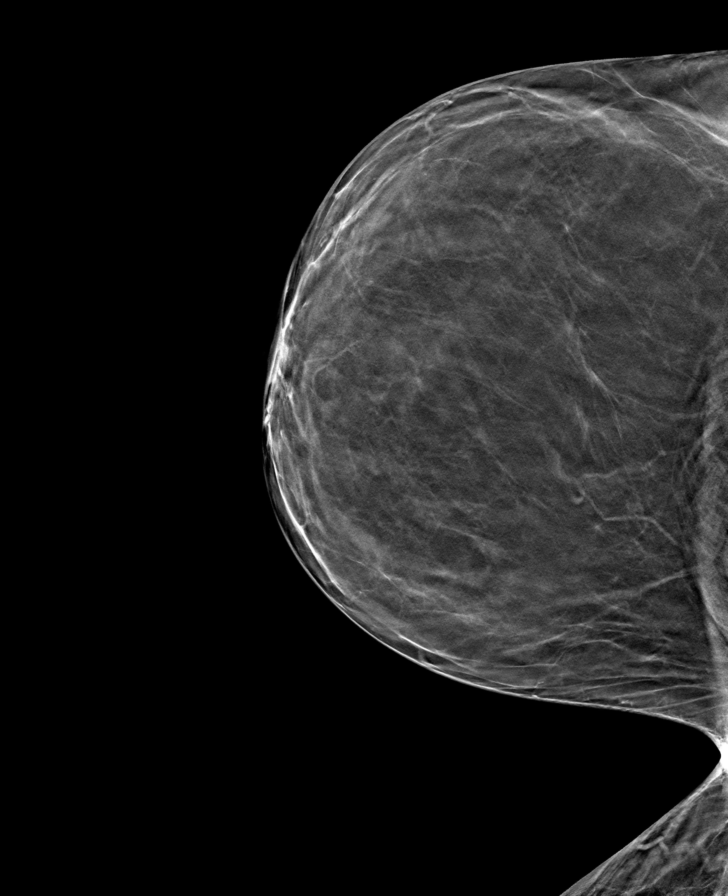

[L MLO tomo · tomo slice 41/82.0]
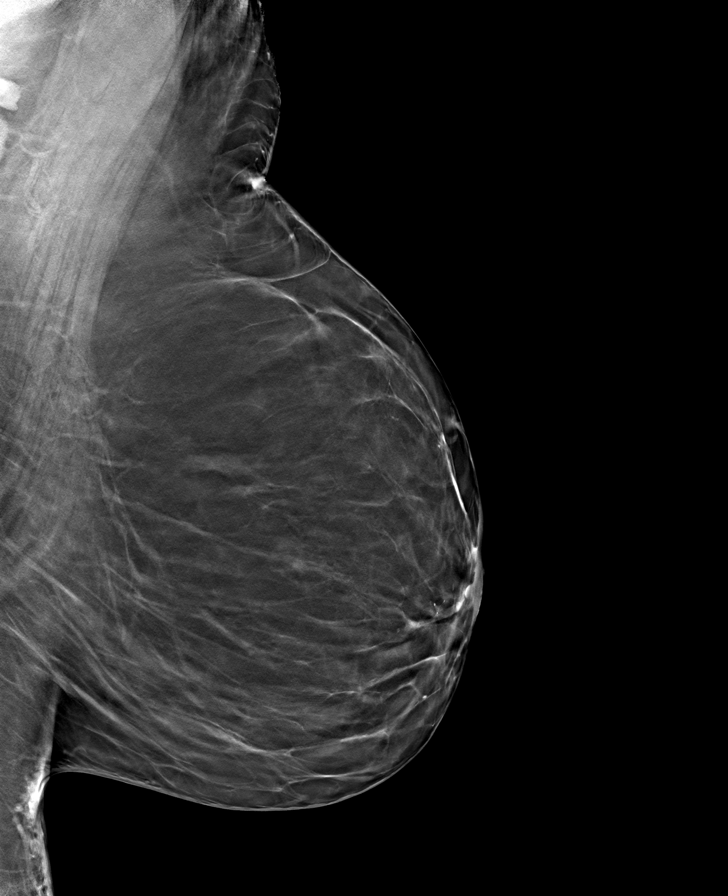

[L CC tomo · tomo slice 37/74.0]
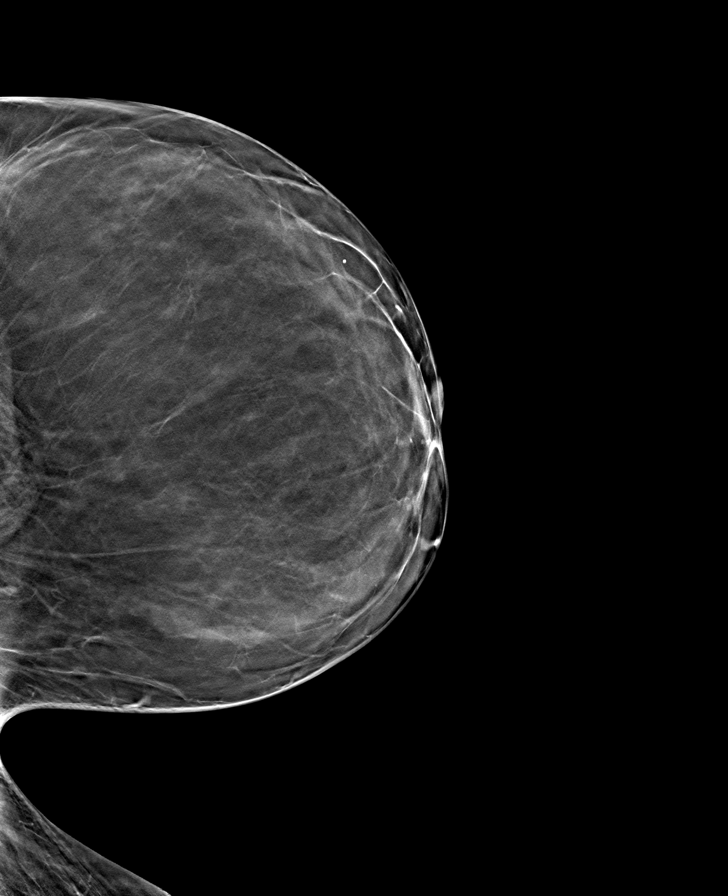

[8 of 24 positions shown; findings below may reference images not displayed]

FINDINGS: No suspicious mass, calcifications, or area of architectural 
distortion in either breast. Overall stable mammographic evaluation.
IMPRESSION: No mammographic findings suggestive for malignancy. 
(BI-RADS 2) Benign findings. Routine mammographic follow-up is recommended.

## 2023-12-01 IMAGING — RF RIGHT HIP INJECTION
1 series · 4 of 4 positions shown · IV contrast (isovue)
Comparison: none

________________________________________________________________________________________________ 
RIGHT HIP INJECTION, 12/01/2023 [DATE]: 
CLINICAL INDICATION: Pelvic and perineal pain. Stiffness of right hip, not 
elsewhere classified. Unilateral primary osteoarthritis, right hip 
Count of known CT and Cardiac Nuclear Medicine studies performed in the previous 
12 months = 0 
TECHNIQUE AND FINDINGS: The indications, alternatives and indications of the 
procedure explained to the patient. Consent was obtained. 
The skin over the right hip was sterilely prepped and draped.  Skin anesthesia 
was obtained using Ethyl chloride. Under fluoroscopic guidance a 22-gauge needle 
was advanced into the hip joint. 5 mL of Isovue 300 MDV was injected to confirm 
the intra-articular position of the needle tip. The hip was then injected with 
80 milligrams of Depo-Medrol and 15 mg of Bupivacaine MDV. The patient tolerated 
the procedure well and left the department in stable condition. Radiation dose 
indices: Air Kerma ([place]) = 4.78 mGy.

[Series 10: run · 4 of 39 frames shown]
[frame 1/39]
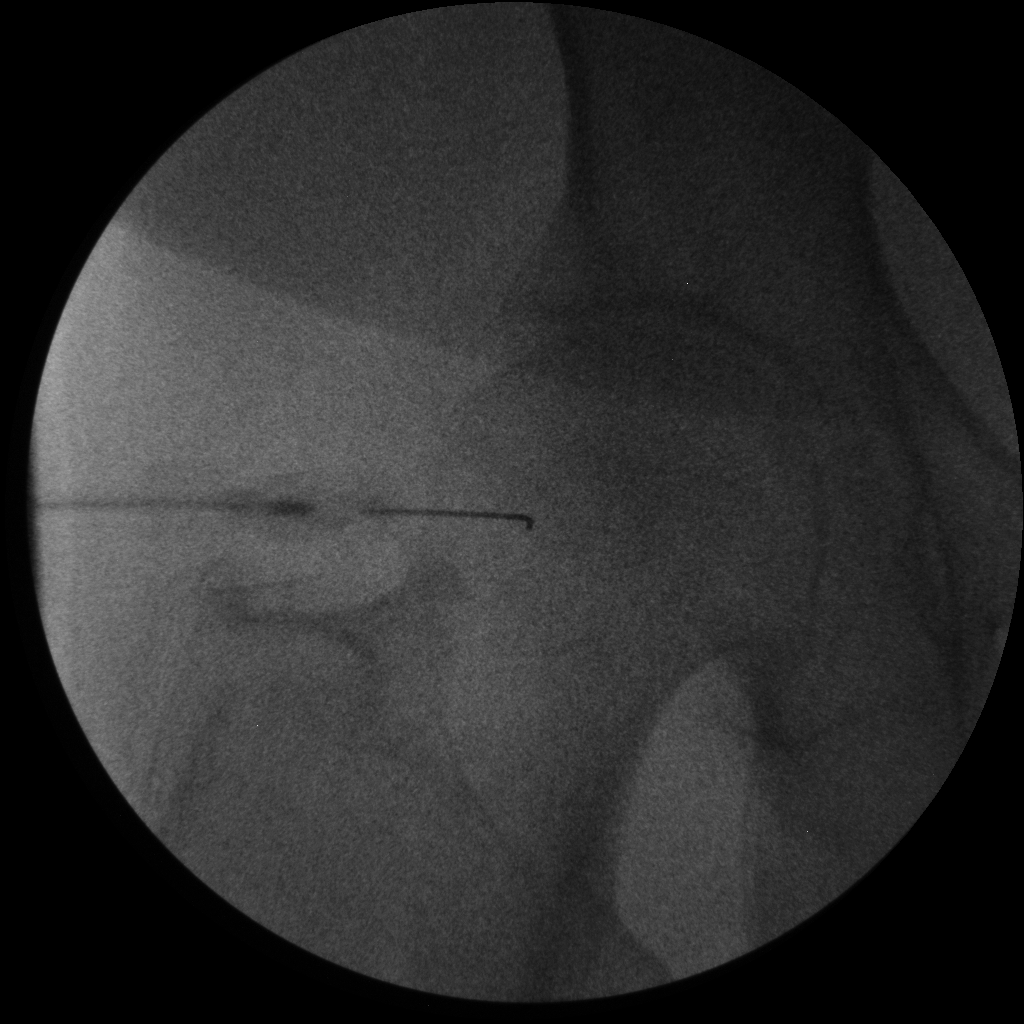
[frame 6/39]
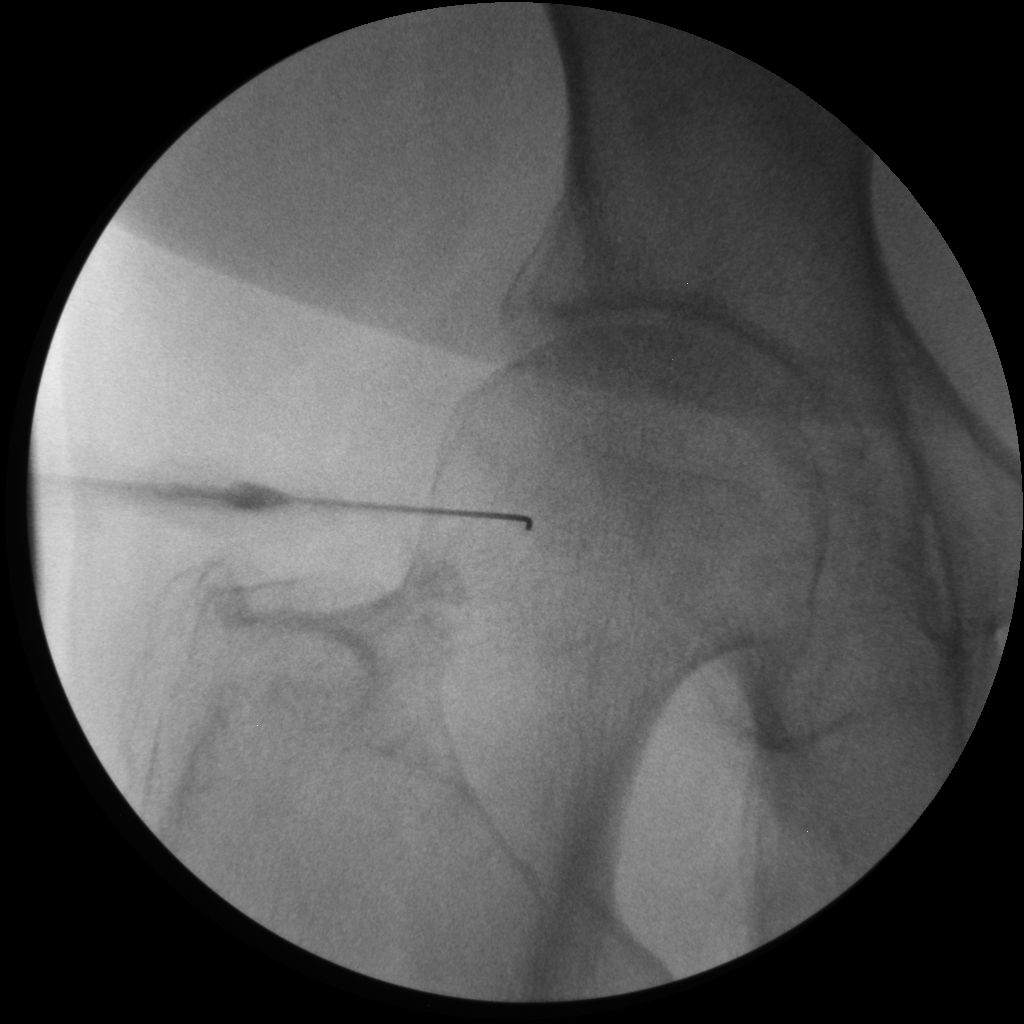
[frame 20/39]
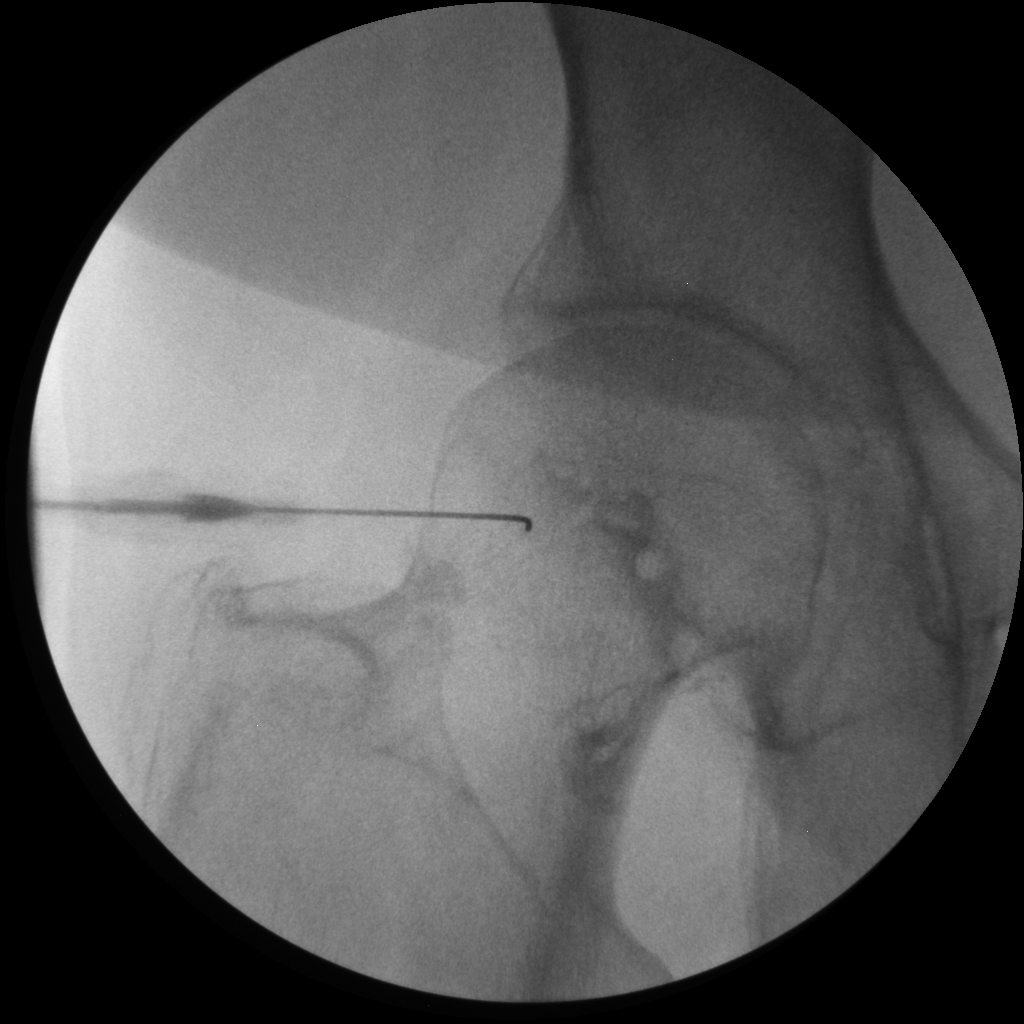
[frame 34/39]
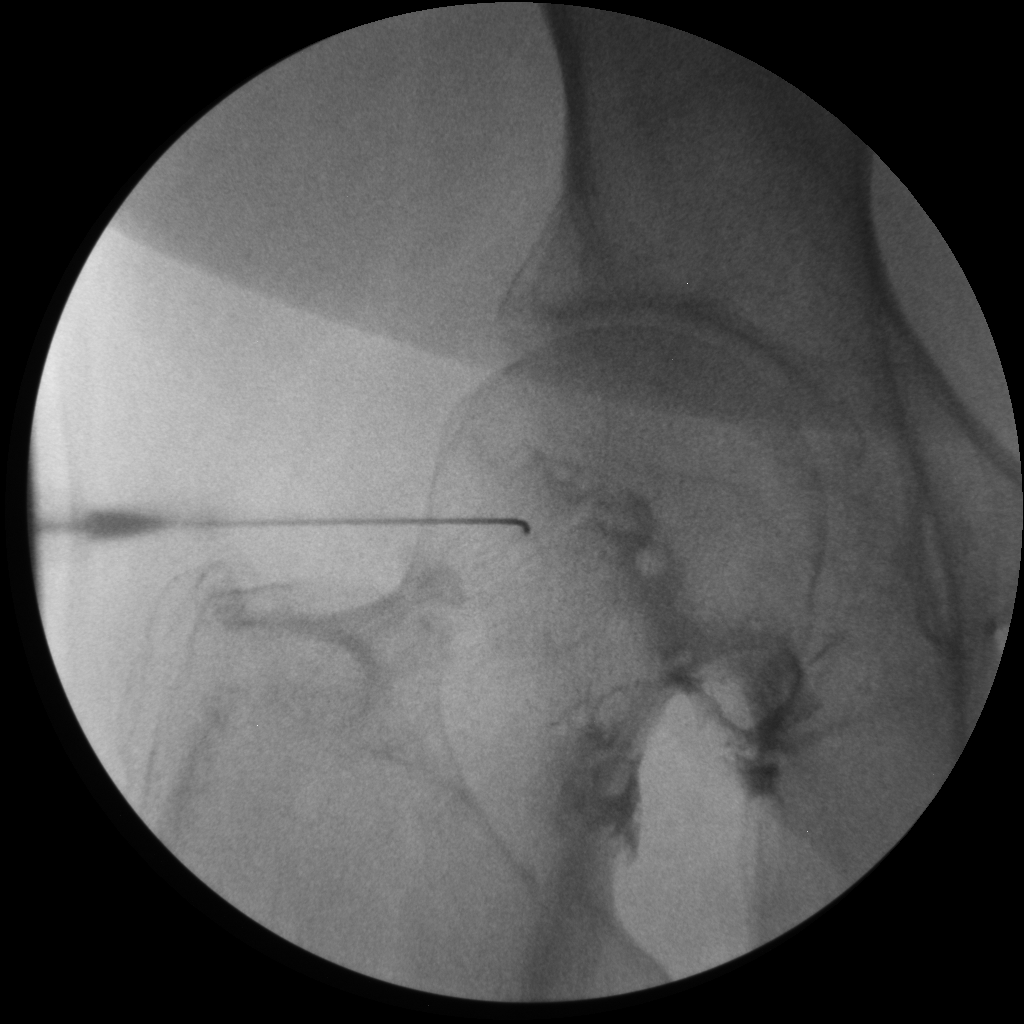

[4 of 4 positions shown; findings below may reference images not displayed]

IMPRESSION: Technically successful fluoroscopically guided hip injection.

## 2024-03-10 ENCOUNTER — Inpatient Hospital Stay: Admit: 2024-03-10 | Payer: MEDICARE | Attending: Interventional Pain Medicine

## 2024-03-10 ENCOUNTER — Ambulatory Visit: Admit: 2024-03-10 | Discharge: 2024-03-10 | Payer: MEDICARE | Attending: Interventional Pain Medicine

## 2024-03-10 VITALS — BP 120/75 | HR 68 | Temp 97.30000°F | Ht 66.0 in | Wt 153.0 lb

## 2024-03-10 DIAGNOSIS — M5416 Radiculopathy, lumbar region: Secondary | ICD-10-CM

## 2024-03-10 NOTE — Progress Notes (Signed)
 [facility]  P: [phone]  F: [phone]        Tiffany Wolfe  (April 08, 1951)    03/10/2024    Subjective:     Tiffany Wolfe is 73 y.o. female who complains today of:     Chief Complaint   Patient presents with    Back Pain    Leg Pain     Right Leg     Patient here today for initial evaluation.  Patient history of back surgery Dr. Sabra Wolfe in many years ago lower lumbar fusion.  Pain is flared up in low back going down the right leg worse with standing walking activity.  Sleep can be impaired she takes Motrin as needed.  She is not on narcotics.  She is not diabetic she is not on blood thinners she is retired used to be a Airline pilot.  Sitting is the best position.  She quit smoking many years ago pain is flared over the last 4 months sciatica down the right leg.  It affects her quality life.    Assessment: Lumbar radiculopathy, lower lumbar fusion    Plan: We discussed options in detail.  Will get x-rays lumbar spine with flexion-extension views to evaluate for instability.  She does not need a new MRI at this point.  For significant sciatica down the right leg I would like to authorize right L5-S1 transforaminal epidural injection under fluoroscopic guidance.  NRS pain level can be 8 out of 10.  OARRS is reviewed.  She does not need narcotics.  She has failed conservative including home excise program physical therapy anti-inflammatories.  Questions answered chart was reviewed she is in agreement the plan.    Allergies:  Environmental/seasonal    Past Medical History:   Diagnosis Date    Fibromyalgia     Hyperlipidemia     Hypertension     Hypothyroidism      Past Surgical History:   Procedure Laterality Date    CHOLECYSTECTOMY      HYSTERECTOMY, VAGINAL      SPINAL FUSION       Family History   Problem Relation Age of Onset    Mult Sclerosis Brother     Cancer Brother      Social History     Socioeconomic History    Marital  status: Married     Spouse name: Not on file    Number of children: Not on file    Years of education: Not on file    Highest education level: Not on file   Occupational History    Not on file   Tobacco Use    Smoking status: Former     Current packs/day: 0.00     Average packs/day: 2.0 packs/day for 30.0 years (60.0 ttl pk-yrs)     Types: Cigarettes     Start date: 03/31/1976     Quit date: 03/31/2006     Years since quitting: 17.9    Smokeless tobacco: Never   Vaping Use    Vaping status: Never Used   Substance and Sexual Activity    Alcohol use: Yes     Alcohol/week: 0.0 standard drinks of alcohol    Drug use: No    Sexual activity: Yes   Other Topics Concern    Not on file   Social History Narrative    Not on file     Social Drivers of Health  Financial Resource Strain: Low Risk  (04/28/2023)    Received from [facility]    Overall Financial Resource Strain (CARDIA)     Difficulty of Paying Living Expenses: Not hard at all   Food Insecurity: No Food Insecurity (04/28/2023)    Received from [facility] Vital Sign     Worried About Running Out of Food in the Last Year: Never true     Ran Out of Food in the Last Year: Never true   Transportation Needs: No Transportation Needs (04/28/2023)    Received from [facility] - Transportation     Lack of Transportation (Medical): No     Lack of Transportation (Non-Medical): No   Physical Activity: Inactive (04/28/2023)    Received from [facility]    Exercise Vital Sign     Days of Exercise per Week: 0 days     Minutes of Exercise per Session: 0 min   Stress: Patient Declined (04/28/2023)    Received from [facility] - Occupational Stress Questionnaire     Feeling of Stress : Patient declined   Social Connections: Moderately Integrated (04/28/2023)    Received from [facility]    Social Connection and Isolation Panel [NHANES]     Frequency of Communication with Friends and Family: More than  three times a week     Frequency of Social Gatherings with Friends and Family: Twice a week     Attends Religious Services: More than 4 times per year     Active Member of [employer] or Organizations: No     Attends Banker Meetings: Never     Marital Status: Married   Catering manager Violence: Not on file   Housing Stability: Unknown (04/28/2023)    Received from [facility] Stability Vital Sign     Unable to Pay for Housing in the Last Year: No     Number of Places Lived in the Last Year: Not on file     Unstable Housing in the Last Year: No       Current Outpatient Medications on File Prior to Visit   Medication Sig Dispense Refill    levothyroxine  (SYNTHROID ) 175 MCG tablet Take 1 tablet by mouth daily      DULoxetine (CYMBALTA) 30 MG extended release capsule Take 1 capsule by mouth daily      polyethyl glycol-propyl glycol 0.4-0.3 % (SYSTANE) 0.4-0.3 % ophthalmic solution 1 drop 2 times daily      gabapentin (NEURONTIN) 300 MG capsule gabapentin 300 mg capsule   TAKE 1 CAPSULE BY MOUTH ONCE DAILY AT BEDTIME FOR 90 DAYS      metoprolol  succinate (TOPROL  XL) 50 MG extended release tablet Take 1 tablet by mouth daily 90 tablet 1    simvastatin  (ZOCOR ) 10 MG tablet Take 1 tablet by mouth daily 90 tablet 1    esomeprazole (NEXIUM) 40 MG delayed release capsule Nexium 40 mg capsule,delayed release   Take 1 capsule every day by oral route for 90 days.      ibuprofen (ADVIL;MOTRIN) 800 MG tablet       methylPREDNISolone  (MEDROL , PAK,) 4 MG tablet Take by mouth. 1 kit 0    fluticasone  (FLONASE ) 50 MCG/ACT nasal spray 1 spray by Nasal route daily 1 each 1    Brimonidine Tartrate (LUMIFY) 0.025 % SOLN 1 drop as needed      hydrOXYzine (ATARAX)  50 MG tablet as needed        No current facility-administered medications on file prior to visit.           HPI    Review of Systems   All other systems reviewed and are negative.        Objective:     Vitals:  BP 120/75   Pulse 68   Temp 97.3 F (36.3 C)  (Temporal)   Ht 1.676 m (5\' 6" )   Wt 69.4 kg (153 lb)   SpO2 98%   BMI 24.69 kg/m Pain Score:   8      Physical Exam  Patient is AO X 3 , recent and remote memory is intact,mood and affect normal,judgement and insight normal. Head normacephalic,eyes - PERRLA, EOMI, No obvious external Ears, Nose, mouth masses seen, neck supple, trachae midline, no abnormal lymph nodes palpated in neck or supraclavicular region. CN's 2-12 grossly intact. Strength functional for ambulation, balance functional, coordination normal. Visualized skin intact, no obvious lesion or visualized cyanosis. No swelling. Pulses intact. No obvious upper motor neuron signs. Sensation grossly intact to light touch. Strength functional upper extremities.  Positive straight leg raise right lower extremity lumbar scar decreased range of motion      Assessment:         Plan:

## 2024-03-11 NOTE — Telephone Encounter (Signed)
 RIGHT L5-S1 TFESI     NO AUTH REQUIRED    OK to schedule procedure approved as above.   Please note sides/levels approved and date range.   (If applicable, sides/levels approved may differ from those ordered)    TO BE SCHEDULED WITH DR Lucianne Muss

## 2024-03-15 NOTE — Telephone Encounter (Signed)
 LMOM FOR PATIENT TO CALL BACK AND SCHEDULE PROCEDURE    RIGHT L5-S1 TFESI      NO AUTH REQUIRED

## 2024-03-16 NOTE — Telephone Encounter (Signed)
 Pt is scheduled

## 2024-03-16 NOTE — Telephone Encounter (Signed)
 LMOM for a return call to schedule procedure.     RIGHT L5-S1 TFESI      NO AUTH REQUIRED

## 2024-03-17 ENCOUNTER — Encounter: Payer: MEDICARE | Attending: Interventional Pain Medicine

## 2024-03-29 ENCOUNTER — Inpatient Hospital Stay: Payer: MEDICARE | Attending: Interventional Pain Medicine

## 2024-03-29 ENCOUNTER — Inpatient Hospital Stay: Payer: MEDICARE

## 2024-03-29 ENCOUNTER — Encounter

## 2024-03-29 DIAGNOSIS — R52 Pain, unspecified: Secondary | ICD-10-CM

## 2024-04-05 ENCOUNTER — Encounter

## 2024-04-05 ENCOUNTER — Inpatient Hospital Stay: Admit: 2024-04-05 | Payer: MEDICARE

## 2024-04-05 ENCOUNTER — Inpatient Hospital Stay: Admit: 2024-04-05 | Discharge: 2024-04-05 | Payer: MEDICARE | Attending: Interventional Pain Medicine

## 2024-04-05 VITALS — BP 124/80 | HR 68 | Temp 97.40000°F | Ht 63.0 in | Wt 153.0 lb

## 2024-04-05 DIAGNOSIS — R52 Pain, unspecified: Secondary | ICD-10-CM

## 2024-04-05 MED ORDER — DEXAMETHASONE SOD PHOSPHATE PF 10 MG/ML IJ SOLN
10 | Freq: Once | INTRAMUSCULAR | Status: AC
Start: 2024-04-05 — End: 2024-04-05
  Administered 2024-04-05: 19:00:00 5 mg

## 2024-04-05 MED ORDER — SODIUM CHLORIDE (PF) 0.9 % IJ SOLN
0.9 | Freq: Once | INTRAMUSCULAR | Status: AC
Start: 2024-04-05 — End: 2024-04-05
  Administered 2024-04-05: 19:00:00 1 mL

## 2024-04-05 MED ORDER — IOHEXOL 300 MG/ML IJ SOLN
300 | Freq: Once | INTRAMUSCULAR | Status: AC | PRN
Start: 2024-04-05 — End: 2024-04-05
  Administered 2024-04-05: 19:00:00 1 mL

## 2024-04-05 MED FILL — SODIUM CHLORIDE (PF) 0.9 % IJ SOLN: 0.9 % | INTRAMUSCULAR | Qty: 1 | Fill #0

## 2024-04-05 MED FILL — OMNIPAQUE 300 MG/ML IJ SOLN: 300 MG/ML | INTRAMUSCULAR | Qty: 1 | Fill #0

## 2024-04-05 MED FILL — DEXAMETHASONE SOD PHOSPHATE PF 10 MG/ML IJ SOLN: 10 MG/ML | INTRAMUSCULAR | Qty: 0.5 | Fill #0

## 2024-04-05 NOTE — Procedures (Addendum)
 [facility]  Neurosurgery and Pain [facility]  [address] [place]  [place]: [phone]  F: [phone]      LUMBAR TRANSFORAMINAL INJECTION      Patient Name: Tiffany Wolfe DOB: 04-Aug-1951  Date: 04/05/2024   Physician: Alois Jakob, DO      Tiffany Wolfe  is here today for interventional pain management.    Preoperatively, the patient presents with radicular pain in the affected area as per history and exam. Patient has failed NSAIDs, PT, and conservative treatment. Patient has significant psychological and functional impairment due to this condition.  Standard ASIPP guidelines were followed and sterile technique used.  Area was cleaned with Betadine x3.  Informed consent was obtained.  Fluoroscopic guidance was used for this procedure.     TRANSFORAMINAL EPIDURAL  There was limited spread of contrast in the anterior epidural space and around the exiting nerve root. The 6 o'clock position of the pedicle was identified. Multiple views of fluoroscopy including lateral were used to confirm accurate needle placement of depth. Live fluoroscopy was used when injecting contrast to ensure no subdural or vascular spread. In total, approximately 5 mg of Dexamethasone and 1.5 ml of 0.9cc of normal saline was injected slowly without difficulty.    Patient tolerated the procedure well, no obvious complications occurred during the procedure.  Patient was appropriately monitored and discharged home in stable condition with their usual motor strength. Post Op instructions were given to patient. Patient told to resume blood thinners if stopped prior to procedure. Relevant and recent imaging reviewed with patient today.              []  Bilateral []  T12-L1 []  L3-4        []  L1-2 [x]  L4-5       [x]  Right []  L2-3 [x]  L5-S1                []  Left                  Eve Rey, DO

## 2024-04-28 ENCOUNTER — Encounter: Payer: MEDICARE | Attending: Neurological Surgery

## 2024-04-28 NOTE — Progress Notes (Unsigned)
 Patient Name: Tiffany Wolfe DOB: 24-Sep-1951        Date: 04/28/2024      Type of Appt: Consult    Reason for appt: SEVERE PAIN /SCIATICA/HIP AREA     Referred by: PSR    Studies done: 03/10/24 Xray Lumbar Spine at [facility]  05/14/24 Xray of Cervical Spine at [facility] (pushed to [facility])    Conservative Treatments (Have you ever tried any)  Physical Therapy in the last 6 months to a year: No  NSAID's: Yes  Narcotics: Yes  Muscle relaxants: Yes  Epidural injections: Tes    Any Blood Thinners :  []  Yes  [x]  No       []  Aspirin    []  Eliquis     []  Xarelto    []  Pletal   []  Plavix    []  Warfarin        Diabetic:  []  Yes   [x]  No   If yes, prescribed insulin: []  Yes   [x]   No      Do you take any : []  Yes   [x]   No      []  Ozempic   []  Wegovy    []  Trulicity    []  Mounjaro     Smoking: []  Yes   [x]  No      REVIEW OF SYSTEMS:    []  Rash     []  Difficulty Urinating   []  Nausea    []  Fever      []  Headaches    []  Bruising/Bleeding Easily    []  Hearing loss     []  Constipation     []  Sleep Disturbance   []  Shortness of breath    []  Diarrhea   []  Neck Pain    [x]  Back Pain
